# Patient Record
Sex: Female | Born: 1935 | Race: White | Hispanic: No | Smoking: Former smoker
Health system: Southern US, Community
[De-identification: ages and names within clinical notes are randomized; demographics above are authoritative.]

## PROBLEM LIST (undated history)

## (undated) DIAGNOSIS — I10 Essential (primary) hypertension: Secondary | ICD-10-CM

## (undated) DIAGNOSIS — E119 Type 2 diabetes mellitus without complications: Secondary | ICD-10-CM

## (undated) DIAGNOSIS — N390 Urinary tract infection, site not specified: Secondary | ICD-10-CM

## (undated) HISTORY — PX: TONSILLECTOMY: SUR1361

## (undated) HISTORY — PX: APPENDECTOMY: SHX54

## (undated) HISTORY — PX: BLADDER SURGERY: SHX569

## (undated) HISTORY — DX: Urinary tract infection, site not specified: N39.0

## (undated) HISTORY — PX: RADICAL HYSTERECTOMY: SHX2283

---

## 2004-03-12 ENCOUNTER — Ambulatory Visit: Payer: Self-pay | Admitting: Internal Medicine

## 2004-04-09 ENCOUNTER — Encounter: Payer: Self-pay | Admitting: Internal Medicine

## 2004-05-08 ENCOUNTER — Encounter: Payer: Self-pay | Admitting: Internal Medicine

## 2004-10-28 ENCOUNTER — Ambulatory Visit: Payer: Self-pay | Admitting: Neurology

## 2005-06-10 ENCOUNTER — Ambulatory Visit: Payer: Self-pay | Admitting: Internal Medicine

## 2006-07-07 ENCOUNTER — Ambulatory Visit: Payer: Self-pay | Admitting: Internal Medicine

## 2006-11-16 ENCOUNTER — Ambulatory Visit: Payer: Self-pay | Admitting: Vascular Surgery

## 2006-12-29 ENCOUNTER — Ambulatory Visit: Payer: Self-pay | Admitting: Urology

## 2007-07-29 ENCOUNTER — Ambulatory Visit: Payer: Self-pay | Admitting: Internal Medicine

## 2007-10-15 ENCOUNTER — Ambulatory Visit: Payer: Self-pay | Admitting: Internal Medicine

## 2007-10-26 ENCOUNTER — Ambulatory Visit: Payer: Self-pay | Admitting: Neurological Surgery

## 2008-05-11 ENCOUNTER — Ambulatory Visit: Payer: Self-pay | Admitting: Vascular Surgery

## 2008-05-12 ENCOUNTER — Ambulatory Visit: Payer: Self-pay | Admitting: Vascular Surgery

## 2008-07-31 ENCOUNTER — Ambulatory Visit: Payer: Self-pay | Admitting: Internal Medicine

## 2008-09-06 ENCOUNTER — Ambulatory Visit: Payer: Self-pay | Admitting: Gastroenterology

## 2009-03-16 ENCOUNTER — Ambulatory Visit: Payer: Self-pay | Admitting: Internal Medicine

## 2009-08-02 ENCOUNTER — Ambulatory Visit: Payer: Self-pay | Admitting: Internal Medicine

## 2009-09-04 ENCOUNTER — Ambulatory Visit: Payer: Self-pay | Admitting: Gastroenterology

## 2010-08-06 ENCOUNTER — Ambulatory Visit: Payer: Self-pay | Admitting: Urology

## 2010-08-22 ENCOUNTER — Ambulatory Visit: Payer: Self-pay | Admitting: Internal Medicine

## 2011-03-08 ENCOUNTER — Emergency Department: Payer: Self-pay | Admitting: *Deleted

## 2011-05-01 ENCOUNTER — Other Ambulatory Visit: Payer: Self-pay | Admitting: Gastroenterology

## 2011-05-01 LAB — CLOSTRIDIUM DIFFICILE BY PCR

## 2011-05-22 ENCOUNTER — Ambulatory Visit: Payer: Self-pay | Admitting: Gastroenterology

## 2011-05-26 LAB — PATHOLOGY REPORT

## 2011-09-02 ENCOUNTER — Ambulatory Visit: Payer: Self-pay | Admitting: Internal Medicine

## 2012-06-09 ENCOUNTER — Ambulatory Visit: Payer: Self-pay | Admitting: Gastroenterology

## 2012-08-23 ENCOUNTER — Emergency Department: Payer: Self-pay | Admitting: Emergency Medicine

## 2012-09-02 ENCOUNTER — Ambulatory Visit: Payer: Self-pay | Admitting: Internal Medicine

## 2014-01-11 ENCOUNTER — Ambulatory Visit: Payer: Self-pay | Admitting: Neurology

## 2014-01-25 ENCOUNTER — Ambulatory Visit: Payer: Self-pay | Admitting: Neurology

## 2014-05-15 ENCOUNTER — Encounter: Payer: Self-pay | Admitting: Neurology

## 2014-10-27 ENCOUNTER — Ambulatory Visit (INDEPENDENT_AMBULATORY_CARE_PROVIDER_SITE_OTHER): Payer: Medicare Other | Admitting: Urology

## 2014-10-27 ENCOUNTER — Encounter: Payer: Self-pay | Admitting: Urology

## 2014-10-27 VITALS — BP 165/65 | HR 64 | Resp 18 | Ht 62.0 in | Wt 150.2 lb

## 2014-10-27 DIAGNOSIS — N362 Urethral caruncle: Secondary | ICD-10-CM | POA: Diagnosis not present

## 2014-10-27 DIAGNOSIS — N390 Urinary tract infection, site not specified: Secondary | ICD-10-CM

## 2014-10-27 DIAGNOSIS — N3941 Urge incontinence: Secondary | ICD-10-CM | POA: Diagnosis not present

## 2014-10-27 DIAGNOSIS — N952 Postmenopausal atrophic vaginitis: Secondary | ICD-10-CM

## 2014-10-27 LAB — URINALYSIS, COMPLETE
BILIRUBIN UA: NEGATIVE
Glucose, UA: NEGATIVE
Nitrite, UA: POSITIVE — AB
PH UA: 5.5 (ref 5.0–7.5)
Protein, UA: NEGATIVE
RBC UA: NEGATIVE
Specific Gravity, UA: 1.02 (ref 1.005–1.030)
Urobilinogen, Ur: 0.2 mg/dL (ref 0.2–1.0)

## 2014-10-27 LAB — MICROSCOPIC EXAMINATION

## 2014-10-27 LAB — BLADDER SCAN AMB NON-IMAGING

## 2014-10-27 MED ORDER — ESTRADIOL 0.1 MG/GM VA CREA: 1.0000 | TOPICAL_CREAM | VAGINAL | Status: DC

## 2014-10-27 NOTE — Progress Notes (Signed)
10/27/2014 11:01 AM   Erin Thornton 11-05-1929 [id number]  Referring provider: Idelle Crouch, MD [name], [name] [address]  Chief Complaint  Patient presents with  . Recurrent UTI  . Establish Care    HPI: 81 yo F returns for follow up for recurrent UTIs.  She was treated for a UTI a few months ago (+UA 08/09/14, no culture).  She reports that she has several infections per year and there are at least 3-4 UAs which are suspicious for infection documented in care everywhere since 10/2014.Marland Kitchen  No fevers with her infections.  She does get confused when she does have infections.    She has recently had dysuria x 2 days, no gross hematuria, no increased urgency or frequency.  She thinks she may have an infection today.  She has urinary urgency and urge incontinence on a weekly basis. She wears one depdends daily.  She was given estrace cream about 3 year ago but quickly ran out of samples and has not used it since.  She has also tried cranberry tabs in the remote past it is not using them currently. She states that the Estrace cream was helpful but very expensive..     No history of kidney stones.      She does have history of cystocele s/p repair many years ago but thinks that it may have recurred.  She also is s/p hysterectomy and oophorectomy.    She was seen in our office in 2014.  She was also followed by [facility] but has not been seen in a few years.    PMH: Past Medical History  Diagnosis Date  . Recurrent UTI     Surgical History: Past Surgical History  Procedure Laterality Date  . Tonsillectomy    . Appendectomy    . Radical hysterectomy    . Bladder surgery      Home Medications:    Medication List       This list is accurate as of: 10/27/14 11:59 PM.  Always use your most recent med list.               benazepril-hydrochlorthiazide 20-12.5 MG per tablet  Commonly known as:  LOTENSIN HCT  Take 1 tablet by mouth daily.     COMBIVENT RESPIMAT 20-100 MCG/ACT Aers respimat  Generic drug:  Ipratropium-Albuterol  INHALE 1 PUFF TWICE A DAY     estradiol 0.1 MG/GM vaginal cream  Commonly known as:  ESTRACE  Place 1 Applicatorful vaginally 3 (three) times a week. Do not use applicator, use only pea sized amount 3 times weekly qhs, apply directly to urethra.     ferrous sulfate 325 (65 FE) MG tablet     FLUoxetine 20 MG tablet  Commonly known as:  PROZAC     fluticasone 50 MCG/ACT nasal spray  Commonly known as:  FLONASE  SPRAY 2 SPRAYS INTO BOTH NOSTRILS EVERY NIGHT     metformin 1000 MG (OSM) 24 hr tablet  Commonly known as:  FORTAMET  Take 1,000 mg by mouth daily.     metoprolol succinate 25 MG 24 hr tablet  Commonly known as:  TOPROL-XL  TAKE 0.5 TABLETS (12.5 MG TOTAL) BY MOUTH ONCE DAILY.     omeprazole 20 MG capsule  Commonly known as:  PRILOSEC  Take 20 mg by mouth daily.     rOPINIRole 0.25 MG tablet  Commonly known as:  REQUIP  Take 0.25 mg by mouth at bedtime.  rosuvastatin 5 MG tablet  Commonly known as:  CRESTOR     SYMBICORT 80-4.5 MCG/ACT inhaler  Generic drug:  budesonide-formoterol  Inhale 2 puffs into the lungs 2 (two) times daily.        Allergies:  Allergies  Allergen Reactions  . Amoxicillin Anaphylaxis  . Apixaban     Other reaction(s): Muscle Pain  . Atorvastatin     Other reaction(s): Other (See Comments) myalgias  . Levofloxacin     Other reaction(s): Other (See Comments) CNS side effects  . Lubiprostone Nausea Only  . Nitrofurantoin Monohyd Macro     Other reaction(s): Unknown  . Penicillins     As a child  . Pravastatin Other (See Comments)    myalgias  . Rosuvastatin     Other reaction(s): Muscle Pain  . Simvastatin Other (See Comments)    myalgias  . Statins     Other reaction(s): Muscle Pain  . Tetracycline     Other reaction(s): Other (See Comments) Hives and throat swelling  . Warfarin Sodium     Other reaction(s): Abdominal Pain  .  Sulfa Antibiotics Itching    Family History: Family History  Problem Relation Age of Onset  . Cancer Father   . Cancer Father   . Heart disease Father   . Parkinson's disease Mother     Social History:  reports that she quit smoking about 30 years ago. Her smoking use included Cigarettes. She does not have any smokeless tobacco history on file. She reports that she drinks alcohol. Her drug history is not on file.  ROS: UROLOGY Frequent Urination?: Yes Hard to postpone urination?: Yes Burning/pain with urination?: Yes Get up at night to urinate?: Yes Leakage of urine?: No Urine stream starts and stops?: No Trouble starting stream?: Yes Do you have to strain to urinate?: Yes Blood in urine?: No Urinary tract infection?: Yes Sexually transmitted disease?: No Injury to kidneys or bladder?: No Painful intercourse?: No Weak stream?: Yes Currently pregnant?: No Vaginal bleeding?: No Last menstrual period?: n  Gastrointestinal Nausea?: No Vomiting?: No Indigestion/heartburn?: Yes Diarrhea?: No Constipation?: Yes  Constitutional Fever: No Night sweats?: No Weight loss?: No Fatigue?: Yes  Skin Skin rash/lesions?: Yes Itching?: Yes  Eyes Blurred vision?: Yes Double vision?: No  Ears/Nose/Throat Sore throat?: Yes Sinus problems?: Yes  Hematologic/Lymphatic Swollen glands?: No Easy bruising?: No  Cardiovascular Leg swelling?: No Chest pain?: Yes  Respiratory Cough?: Yes Shortness of breath?: Yes  Endocrine Excessive thirst?: No  Musculoskeletal Back pain?: No Joint pain?: Yes  Neurological Headaches?: No Dizziness?: Yes  Psychologic Depression?: No Anxiety?: Yes  Physical Exam: BP 165/65 mmHg  Pulse 64  Resp 18  Ht _0  (1.575 m)  Wt 150 lb 3.2 oz (68.13 kg)  BMI 27.46 kg/m2  Constitutional:  Alert and oriented, No acute distress. HEENT: [place] AT, moist mucus membranes.  Trachea midline, no masses. Cardiovascular: No clubbing, cyanosis,  or edema. Respiratory: Normal respiratory effort, no increased work of breathing. GI: Abdomen is soft, nontender, nondistended, no abdominal masses GU:  Pelvic exam shows evidence of atrophic vaginitis otherwise normal external genitalia. Urethral meatus with small caruncle at the 06:00 position.  Decent vaginal support with minimal cystocele or rectocele. Skin: No rashes, bruises or suspicious lesions. Neurologic: Grossly intact, no focal deficits, moving all 4 extremities. Psychiatric: Normal mood and affect.  Laboratory Data: Comprehensive Metabolic Panel (CMP) - Final result (06/26/2014 8:40 AM) Comprehensive Metabolic Panel (CMP) - Final result (06/26/2014 8:40 AM)  Component Value Range  Glucose 100 70-110 mg/dL  Sodium 137 136-145 mmol/L  Potassium 4.7 3.6-5.1 mmol/L  Chloride 99 97-109 mmol/L  Carbon Dioxide (CO2) 31.0 22.0-32.0 mmol/L  Urea Nitrogen (BUN) 21 7-25 mg/dL  Creatinine 0.9 0.6-1.1 mg/dL  Glomerular Filtration Rate (eGFR), MDRD Estimate 60 (L) >60 mL/min/1.73sq m      Urinalysis Results for orders placed or performed in visit on 10/27/14  Microscopic Examination  Result Value Ref Range   WBC, UA 11-30 (A) 0 -  5 /hpf   RBC, UA 0-2 0 -  2 /hpf   Epithelial Cells (non renal) 0-10 0 - 10 /hpf   Bacteria, UA Many (A) None seen/Few  Urinalysis, Complete  Result Value Ref Range   Specific Gravity, UA 1.020 1.005 - 1.030   pH, UA 5.5 5.0 - 7.5   Color, UA Yellow Yellow   Appearance Ur Cloudy (A) Clear   Leukocytes, UA 2+ (A) Negative   Protein, UA Negative Negative/Trace   Glucose, UA Negative Negative   Ketones, UA Trace (A) Negative   RBC, UA Negative Negative   Bilirubin, UA Negative Negative   Urobilinogen, Ur 0.2 0.2 - 1.0 mg/dL   Nitrite, UA Positive (A) Negative   Microscopic Examination See below:   BLADDER SCAN AMB NON-IMAGING  Result Value Ref Range   Scan Result 76m     Pertinent Imaging: PVR 23 mL  Assessment & Plan:  A 81year-old  female with recurrent urinary tract infections, atrophic vaginitis and urge incontinence. She was also noted to have an incidental asymptomatic urethral caruncle on exam today.  1. Recurrent UTI Adressed hygiene issues today (lysed to avoid perfumed soaps, pouring vinegar over her urethra) as well as use of cranberry tabs and a vaginal Estridge and cream. We discussed how to use these products effectively. She is interested in pursuing both and samples of Estrace cream given his along with a prescription.  Although she does not have a history of stones, I do think it's reasonable to go ahead and proceed with a renal ultrasound to ensure that there is no upper tract anatomic issues contributing to her infections. We will order renal ultrasound for further workup.  UA suspicious for infection today, we will treat based on culture and sensitivity data. Agreeable with this plan. - Urinalysis, Complete - BLADDER SCAN AMB NON-IMAGING - [place]; Future - CULTURE, URINE COMPREHENSIVE  2. Urge incontinence Infrequent episodes of urge incontinence, approximately once per week. We discussed medications including possible side effects and she would prefer to avoid these if possible.  3. Atrophic vaginitis As above  4. Urethral caruncle As above    Return in about 3 months (around 01/27/2015) for any provider, symptoms recheck.  AHollice Espy MD  [address] [place] [phone] I spent 30 min with this patient of which greater than 50% was spent in counseling and coordination of care with the patient.

## 2014-10-28 ENCOUNTER — Encounter: Payer: Self-pay | Admitting: Urology

## 2014-10-30 ENCOUNTER — Telehealth: Payer: Self-pay | Admitting: Urology

## 2014-10-30 DIAGNOSIS — N390 Urinary tract infection, site not specified: Secondary | ICD-10-CM

## 2014-10-30 LAB — CULTURE, URINE COMPREHENSIVE

## 2014-10-30 NOTE — Telephone Encounter (Signed)
Ucx +.  Many allergies.  What happens when she takes cipro?  If just rash, itching or GI upset, she should benadryl.  Cipro 500 mg bid x 7 days (script not in yet).    Vanna Scotland, MD

## 2014-10-31 MED ORDER — CIPROFLOXACIN HCL 500 MG PO TABS: 500.0000 mg | ORAL_TABLET | Freq: Two times a day (BID) | ORAL | Status: DC

## 2014-10-31 NOTE — Telephone Encounter (Signed)
Spoke with pt in reference positive urine cx. Pt stated her reaction to cipro is dizziness but has ways of dealing with dizziness. Per Dr. Apolinar Junes medication was called in. Pt voiced understanding. Medication called into pharmacy.

## 2014-11-07 ENCOUNTER — Ambulatory Visit
Admission: RE | Admit: 2014-11-07 | Discharge: 2014-11-07 | Disposition: A | Discharge status: Home / Self Care | Payer: Medicare Other | Source: Ambulatory Visit | Attending: Urology | Admitting: Urology

## 2014-11-07 DIAGNOSIS — N390 Urinary tract infection, site not specified: Secondary | ICD-10-CM | POA: Diagnosis not present

## 2014-11-08 ENCOUNTER — Telehealth: Payer: Self-pay | Admitting: *Deleted

## 2014-11-08 NOTE — Telephone Encounter (Signed)
I spoke w/the patient and relayed their lab results.  The pt indicated understanding and had no questions. 

## 2014-11-08 NOTE — Telephone Encounter (Signed)
-----   Message from Vanna Scotland, MD sent at 11/08/2014 10:41 AM EDT ----- Please let this patient know that her renal ultrasound was normal.  We will plan to see her in follow up as scheduled.  Vanna Scotland, MD

## 2015-01-17 ENCOUNTER — Encounter: Payer: Self-pay | Admitting: *Deleted

## 2015-01-29 ENCOUNTER — Ambulatory Visit: Payer: Medicare Other | Admitting: Urology

## 2015-02-01 ENCOUNTER — Other Ambulatory Visit: Payer: Self-pay | Admitting: Neurology

## 2015-02-01 DIAGNOSIS — R410 Disorientation, unspecified: Secondary | ICD-10-CM

## 2015-02-01 DIAGNOSIS — R42 Dizziness and giddiness: Secondary | ICD-10-CM

## 2015-02-05 ENCOUNTER — Ambulatory Visit
Admission: RE | Admit: 2015-02-05 | Discharge: 2015-02-05 | Disposition: A | Discharge status: Home / Self Care | Payer: Medicare Other | Source: Ambulatory Visit | Attending: Neurology | Admitting: Neurology

## 2015-02-05 DIAGNOSIS — R410 Disorientation, unspecified: Secondary | ICD-10-CM | POA: Diagnosis present

## 2015-02-05 DIAGNOSIS — R42 Dizziness and giddiness: Secondary | ICD-10-CM | POA: Diagnosis present

## 2015-02-05 DIAGNOSIS — I739 Peripheral vascular disease, unspecified: Secondary | ICD-10-CM | POA: Diagnosis not present

## 2015-02-26 ENCOUNTER — Other Ambulatory Visit: Payer: Self-pay | Admitting: Nurse Practitioner

## 2015-02-26 DIAGNOSIS — K5909 Other constipation: Secondary | ICD-10-CM

## 2015-02-26 DIAGNOSIS — R109 Unspecified abdominal pain: Secondary | ICD-10-CM

## 2015-02-26 DIAGNOSIS — K5730 Diverticulosis of large intestine without perforation or abscess without bleeding: Secondary | ICD-10-CM

## 2015-03-23 ENCOUNTER — Ambulatory Visit: Payer: Medicare Other

## 2015-04-03 ENCOUNTER — Ambulatory Visit: Payer: Medicare Other

## 2015-04-06 ENCOUNTER — Ambulatory Visit
Admission: RE | Admit: 2015-04-06 | Discharge: 2015-04-06 | Disposition: A | Discharge status: Home / Self Care | Payer: Medicare Other | Source: Ambulatory Visit | Attending: Nurse Practitioner | Admitting: Nurse Practitioner

## 2015-04-06 DIAGNOSIS — K5909 Other constipation: Secondary | ICD-10-CM | POA: Diagnosis not present

## 2015-04-06 DIAGNOSIS — K5730 Diverticulosis of large intestine without perforation or abscess without bleeding: Secondary | ICD-10-CM

## 2015-04-06 DIAGNOSIS — K449 Diaphragmatic hernia without obstruction or gangrene: Secondary | ICD-10-CM | POA: Diagnosis not present

## 2015-04-06 DIAGNOSIS — R109 Unspecified abdominal pain: Secondary | ICD-10-CM | POA: Diagnosis present

## 2015-04-06 DIAGNOSIS — K760 Fatty (change of) liver, not elsewhere classified: Secondary | ICD-10-CM | POA: Insufficient documentation

## 2015-04-06 DIAGNOSIS — Z0189 Encounter for other specified special examinations: Secondary | ICD-10-CM | POA: Insufficient documentation

## 2015-04-06 DIAGNOSIS — I7090 Unspecified atherosclerosis: Secondary | ICD-10-CM | POA: Insufficient documentation

## 2015-04-06 HISTORY — DX: Essential (primary) hypertension: I10

## 2015-04-06 HISTORY — DX: Type 2 diabetes mellitus without complications: E11.9

## 2015-04-06 MED ORDER — IOHEXOL 300 MG/ML  SOLN
100.0000 mL | Freq: Once | INTRAMUSCULAR | Status: AC | PRN
  Administered 2015-04-06: 100 mL via INTRAVENOUS

## 2015-06-25 ENCOUNTER — Emergency Department: Payer: Medicare Other

## 2015-06-25 ENCOUNTER — Emergency Department
Admission: EM | Admit: 2015-06-25 | Discharge: 2015-06-25 | Disposition: A | Discharge status: Home / Self Care | Payer: Medicare Other | Attending: Emergency Medicine | Admitting: Emergency Medicine

## 2015-06-25 ENCOUNTER — Encounter: Payer: Self-pay | Admitting: Emergency Medicine

## 2015-06-25 DIAGNOSIS — Y92002 Bathroom of unspecified non-institutional (private) residence single-family (private) house as the place of occurrence of the external cause: Secondary | ICD-10-CM | POA: Insufficient documentation

## 2015-06-25 DIAGNOSIS — Z79899 Other long term (current) drug therapy: Secondary | ICD-10-CM | POA: Insufficient documentation

## 2015-06-25 DIAGNOSIS — Z87891 Personal history of nicotine dependence: Secondary | ICD-10-CM | POA: Diagnosis not present

## 2015-06-25 DIAGNOSIS — S22000A Wedge compression fracture of unspecified thoracic vertebra, initial encounter for closed fracture: Secondary | ICD-10-CM | POA: Insufficient documentation

## 2015-06-25 DIAGNOSIS — R51 Headache: Secondary | ICD-10-CM | POA: Diagnosis not present

## 2015-06-25 DIAGNOSIS — W1830XA Fall on same level, unspecified, initial encounter: Secondary | ICD-10-CM | POA: Insufficient documentation

## 2015-06-25 DIAGNOSIS — Y998 Other external cause status: Secondary | ICD-10-CM | POA: Insufficient documentation

## 2015-06-25 DIAGNOSIS — I10 Essential (primary) hypertension: Secondary | ICD-10-CM | POA: Diagnosis not present

## 2015-06-25 DIAGNOSIS — Z7984 Long term (current) use of oral hypoglycemic drugs: Secondary | ICD-10-CM | POA: Insufficient documentation

## 2015-06-25 DIAGNOSIS — M545 Low back pain: Secondary | ICD-10-CM | POA: Diagnosis present

## 2015-06-25 DIAGNOSIS — Y9301 Activity, walking, marching and hiking: Secondary | ICD-10-CM | POA: Insufficient documentation

## 2015-06-25 DIAGNOSIS — E119 Type 2 diabetes mellitus without complications: Secondary | ICD-10-CM | POA: Insufficient documentation

## 2015-06-25 DIAGNOSIS — Z7951 Long term (current) use of inhaled steroids: Secondary | ICD-10-CM | POA: Insufficient documentation

## 2015-06-25 MED ORDER — HYDROCODONE-ACETAMINOPHEN 5-325 MG PO TABS: 1.0000 | ORAL_TABLET | ORAL | Status: DC | PRN

## 2015-06-25 MED ORDER — ACETAMINOPHEN 325 MG PO TABS
650.0000 mg | ORAL_TABLET | Freq: Once | ORAL | Status: AC
  Administered 2015-06-25: 650 mg via ORAL
  Filled 2015-06-25: qty 2

## 2015-06-25 MED ORDER — HYDROCODONE-ACETAMINOPHEN 5-325 MG PO TABS
1.0000 | ORAL_TABLET | Freq: Once | ORAL | Status: DC
  Filled 2015-06-25: qty 1

## 2015-06-25 NOTE — ED Notes (Signed)
Pt returned from radiology.

## 2015-06-25 NOTE — ED Provider Notes (Signed)
[facility] Emergency Department Provider Note  ____________________________________________    I have reviewed the triage vital signs and the nursing notes.   HISTORY  Chief Complaint Fall    HPI Erin Thornton is a 82 y.o. female who presents after a fall. Patient reports she got up in the middle of the night to go to the bathroom she did use her walker but she thinks she pushed it too far of head of herself and lost her balance and fell backwards. She reports striking her lower back and her head. Currently she complains primarily of lower back pain and pain in her head. No neuro deficits. No dizziness. No chest pain no palpitations. No fevers or chills. No cough or shortness of breath.     Past Medical History  Diagnosis Date  . Recurrent UTI   . Hypertension   . Diabetes mellitus without complication [facility])     Patient takes Metformin.    There are no active problems to display for this patient.   Past Surgical History  Procedure Laterality Date  . Tonsillectomy    . Appendectomy    . Radical hysterectomy    . Bladder surgery      Current Outpatient Rx  Name  Route  Sig  Dispense  Refill  . benazepril-hydrochlorthiazide (LOTENSIN HCT) 20-12.5 MG per tablet   Oral   Take 1 tablet by mouth daily.      5   . ciprofloxacin (CIPRO) 500 MG tablet   Oral   Take 1 tablet (500 mg total) by mouth every 12 (twelve) hours.   14 tablet   0   . estradiol (ESTRACE) 0.1 MG/GM vaginal cream   Vaginal   Place 1 Applicatorful vaginally 3 (three) times a week. Do not use applicator, use only pea sized amount 3 times weekly qhs, apply directly to urethra.   42.5 g   12   . ferrous sulfate 325 (65 FE) MG tablet            11   . FLUoxetine (PROZAC) 20 MG tablet               . fluticasone (FLONASE) 50 MCG/ACT nasal spray      SPRAY 2 SPRAYS INTO BOTH NOSTRILS EVERY NIGHT      5   . Ipratropium-Albuterol (COMBIVENT RESPIMAT) 20-100  MCG/ACT AERS respimat      INHALE 1 PUFF TWICE A DAY         . metformin (FORTAMET) 1000 MG (OSM) 24 hr tablet   Oral   Take 1,000 mg by mouth daily.      5   . metoprolol succinate (TOPROL-XL) 25 MG 24 hr tablet      TAKE 0.5 TABLETS (12.5 MG TOTAL) BY MOUTH ONCE DAILY.      3   . omeprazole (PRILOSEC) 20 MG capsule   Oral   Take 20 mg by mouth daily.      6   . rOPINIRole (REQUIP) 0.25 MG tablet   Oral   Take 0.25 mg by mouth at bedtime.      5   . rosuvastatin (CRESTOR) 5 MG tablet               . SYMBICORT 80-4.5 MCG/ACT inhaler   Inhalation   Inhale 2 puffs into the lungs 2 (two) times daily.      3     Dispense as written.     Allergies Amoxicillin; Apixaban; Atorvastatin;  Levofloxacin; Lubiprostone; Nitrofurantoin monohyd macro; Penicillins; Pravastatin; Rosuvastatin; Simvastatin; Statins; Tetracycline; Warfarin sodium; and Sulfa antibiotics  Family History  Problem Relation Age of Onset  . Cancer Father     lung  . Heart disease Father   . Parkinson's disease Mother   . Colon cancer Father     Social History Social History  Substance Use Topics  . Smoking status: Former Smoker    Types: Cigarettes    Quit date: 10/26/1984  . Smokeless tobacco: None  . Alcohol Use: 0.0 oz/week    0 Standard drinks or equivalent per week     Comment: occasional    Review of Systems  Constitutional: Negative for fever. Eyes: Negative for redness ENT: Negative for sore throat Cardiovascular: Negative for chest pain or palpitations Respiratory: Negative for shortness of breath. Gastrointestinal: Negative for abdominal pain Genitourinary: Negative for dysuria. Musculoskeletal: Positive for back pain, mild neck pain Skin: Negative for rash. Neurological: Negative for focal weakness. Positive for headache Psychiatric: no anxiety    ____________________________________________   PHYSICAL EXAM:  VITAL SIGNS: ED Triage Vitals  Enc Vitals Group      BP 06/25/15 0818 146/57 mmHg     Pulse Rate 06/25/15 0818 74     Resp 06/25/15 0818 20     Temp 06/25/15 0819 97.7 F (36.5 C)     Temp Source 06/25/15 0818 Oral     SpO2 06/25/15 0818 97 %     Weight 06/25/15 0818 145 lb (65.772 kg)     Height 06/25/15 0818  (1.575 m)     Head Cir --      Peak Flow --      Pain Score 06/25/15 0818 6     Pain Loc --      Pain Edu? --      Excl. in GC? --      Constitutional: Alert and oriented. Well appearing and in no distress.  Eyes: Conjunctivae are normal. No erythema or injection ENT   Head: Normocephalic and atraumatic.   Mouth/Throat: Mucous membranes are moist. Cardiovascular: Normal rate, regular rhythm. Normal and symmetric distal pulses are present in the upper extremities. No murmurs or rubs  Respiratory: Normal respiratory effort without tachypnea nor retractions.  Gastrointestinal: Soft and non-tender in all quadrants. No distention. There is no CVA tenderness. Genitourinary: deferred Musculoskeletal: Nontender with normal range of motion in all extremities. No lower extremity tenderness nor edema. Range of motion at the hips is normal, no tenderness to palpation of the pelvis. Mild lumbar vertebral tenderness to palpation. Full range motion of the neck, no cervical tenderness to palpation or thoracic tenderness to palpation. Neurologic:  Normal speech and language. No gross focal neurologic deficits are appreciated. Skin:  Skin is warm, dry and intact. No rash noted. Psychiatric: Mood and affect are normal. Patient exhibits appropriate insight and judgment.  ____________________________________________    LABS (pertinent positives/negatives)  Labs Reviewed - No data to display  ____________________________________________   EKG  None  ____________________________________________    RADIOLOGY  T12 compression fracture  ____________________________________________   PROCEDURES  Procedure(s)  performed: none  Critical Care performed: none  ____________________________________________   INITIAL IMPRESSION / ASSESSMENT AND PLAN / ED COURSE  Pertinent labs & imaging results that were available during my care of the patient were reviewed by me and considered in my medical decision making (see chart for details).  Patient presents after a fall, likely mechanical. We will obtain CT head and cervical spine and lumbar x-rays.  Patient with compression fracture of T12 vertebrae. Neurologically intact. Discussed with patient and family. ____________________________________________   FINAL CLINICAL IMPRESSION(S) / ED DIAGNOSES  Final diagnoses:  Compression fracture of body of thoracic vertebra          Jene Everyobert Jaryn Rosko, MD 06/25/15 1652

## 2015-06-25 NOTE — ED Notes (Signed)
Pt to ed via [facility] from home with reports of having a fall early this am. Pt was up around 3am to go to br and fell backwards landing on her tailbone and states she hit the back of her head, no loc time of fall. Ems states vss. Pt c/o low back pain. Pt a/ox3 on arrival to ed.

## 2015-06-25 NOTE — ED Notes (Signed)
Patient transported to CT 

## 2015-06-25 NOTE — ED Notes (Signed)
Ambulated with walker. Pt has increased pain with ambulation but is able to ambulated. Dr Ernest Haberkinnner notified. Pt has family able to stay with her for a night or two and help with food.

## 2015-07-04 ENCOUNTER — Other Ambulatory Visit: Payer: Self-pay | Admitting: Orthopedic Surgery

## 2015-07-04 DIAGNOSIS — S22000A Wedge compression fracture of unspecified thoracic vertebra, initial encounter for closed fracture: Secondary | ICD-10-CM

## 2015-07-05 ENCOUNTER — Ambulatory Visit
Admission: RE | Admit: 2015-07-05 | Discharge: 2015-07-05 | Disposition: A | Discharge status: Home / Self Care | Payer: Medicare Other | Source: Ambulatory Visit | Attending: Orthopedic Surgery | Admitting: Orthopedic Surgery

## 2015-07-05 DIAGNOSIS — S22000A Wedge compression fracture of unspecified thoracic vertebra, initial encounter for closed fracture: Secondary | ICD-10-CM | POA: Insufficient documentation

## 2015-07-05 DIAGNOSIS — M4854XA Collapsed vertebra, not elsewhere classified, thoracic region, initial encounter for fracture: Secondary | ICD-10-CM | POA: Diagnosis not present

## 2015-07-05 SURGERY — LITHOTRIPSY, ESWL

## 2015-07-06 ENCOUNTER — Encounter: Payer: Self-pay | Admitting: *Deleted

## 2015-07-06 ENCOUNTER — Ambulatory Visit: Payer: Medicare Other | Admitting: Certified Registered"

## 2015-07-06 ENCOUNTER — Ambulatory Visit
Admission: RE | Admit: 2015-07-06 | Discharge: 2015-07-06 | Disposition: A | Discharge status: Home / Self Care | Payer: Medicare Other | Source: Ambulatory Visit | Attending: Orthopedic Surgery | Admitting: Orthopedic Surgery

## 2015-07-06 ENCOUNTER — Encounter
Admission: RE | Disposition: A | Discharge status: Home / Self Care | Payer: Self-pay | Source: Ambulatory Visit | Attending: Orthopedic Surgery

## 2015-07-06 ENCOUNTER — Ambulatory Visit: Payer: Medicare Other

## 2015-07-06 DIAGNOSIS — I739 Peripheral vascular disease, unspecified: Secondary | ICD-10-CM | POA: Diagnosis not present

## 2015-07-06 DIAGNOSIS — S22080A Wedge compression fracture of T11-T12 vertebra, initial encounter for closed fracture: Secondary | ICD-10-CM | POA: Diagnosis not present

## 2015-07-06 DIAGNOSIS — Z7951 Long term (current) use of inhaled steroids: Secondary | ICD-10-CM | POA: Diagnosis not present

## 2015-07-06 DIAGNOSIS — K219 Gastro-esophageal reflux disease without esophagitis: Secondary | ICD-10-CM | POA: Diagnosis not present

## 2015-07-06 DIAGNOSIS — Z419 Encounter for procedure for purposes other than remedying health state, unspecified: Secondary | ICD-10-CM

## 2015-07-06 DIAGNOSIS — Z7982 Long term (current) use of aspirin: Secondary | ICD-10-CM | POA: Insufficient documentation

## 2015-07-06 DIAGNOSIS — Z7984 Long term (current) use of oral hypoglycemic drugs: Secondary | ICD-10-CM | POA: Insufficient documentation

## 2015-07-06 DIAGNOSIS — G2581 Restless legs syndrome: Secondary | ICD-10-CM | POA: Diagnosis not present

## 2015-07-06 DIAGNOSIS — W07XXXA Fall from chair, initial encounter: Secondary | ICD-10-CM | POA: Insufficient documentation

## 2015-07-06 DIAGNOSIS — Z803 Family history of malignant neoplasm of breast: Secondary | ICD-10-CM | POA: Diagnosis not present

## 2015-07-06 DIAGNOSIS — Z882 Allergy status to sulfonamides status: Secondary | ICD-10-CM | POA: Insufficient documentation

## 2015-07-06 DIAGNOSIS — F419 Anxiety disorder, unspecified: Secondary | ICD-10-CM | POA: Diagnosis not present

## 2015-07-06 DIAGNOSIS — Z833 Family history of diabetes mellitus: Secondary | ICD-10-CM | POA: Diagnosis not present

## 2015-07-06 DIAGNOSIS — Z888 Allergy status to other drugs, medicaments and biological substances status: Secondary | ICD-10-CM | POA: Insufficient documentation

## 2015-07-06 DIAGNOSIS — Z9889 Other specified postprocedural states: Secondary | ICD-10-CM | POA: Diagnosis not present

## 2015-07-06 DIAGNOSIS — Z79899 Other long term (current) drug therapy: Secondary | ICD-10-CM | POA: Diagnosis not present

## 2015-07-06 DIAGNOSIS — Z9049 Acquired absence of other specified parts of digestive tract: Secondary | ICD-10-CM | POA: Diagnosis not present

## 2015-07-06 DIAGNOSIS — Z95820 Peripheral vascular angioplasty status with implants and grafts: Secondary | ICD-10-CM | POA: Diagnosis not present

## 2015-07-06 DIAGNOSIS — E785 Hyperlipidemia, unspecified: Secondary | ICD-10-CM | POA: Diagnosis not present

## 2015-07-06 DIAGNOSIS — E119 Type 2 diabetes mellitus without complications: Secondary | ICD-10-CM | POA: Diagnosis not present

## 2015-07-06 DIAGNOSIS — Z8249 Family history of ischemic heart disease and other diseases of the circulatory system: Secondary | ICD-10-CM | POA: Insufficient documentation

## 2015-07-06 DIAGNOSIS — J449 Chronic obstructive pulmonary disease, unspecified: Secondary | ICD-10-CM | POA: Diagnosis not present

## 2015-07-06 DIAGNOSIS — Z820 Family history of epilepsy and other diseases of the nervous system: Secondary | ICD-10-CM | POA: Insufficient documentation

## 2015-07-06 DIAGNOSIS — M1990 Unspecified osteoarthritis, unspecified site: Secondary | ICD-10-CM | POA: Diagnosis not present

## 2015-07-06 DIAGNOSIS — Z881 Allergy status to other antibiotic agents status: Secondary | ICD-10-CM | POA: Diagnosis not present

## 2015-07-06 DIAGNOSIS — Z880 Allergy status to penicillin: Secondary | ICD-10-CM | POA: Diagnosis not present

## 2015-07-06 DIAGNOSIS — Z800 Family history of malignant neoplasm of digestive organs: Secondary | ICD-10-CM | POA: Insufficient documentation

## 2015-07-06 DIAGNOSIS — Z90710 Acquired absence of both cervix and uterus: Secondary | ICD-10-CM | POA: Insufficient documentation

## 2015-07-06 DIAGNOSIS — M5030 Other cervical disc degeneration, unspecified cervical region: Secondary | ICD-10-CM | POA: Insufficient documentation

## 2015-07-06 DIAGNOSIS — S22089A Unspecified fracture of T11-T12 vertebra, initial encounter for closed fracture: Secondary | ICD-10-CM | POA: Diagnosis present

## 2015-07-06 DIAGNOSIS — F329 Major depressive disorder, single episode, unspecified: Secondary | ICD-10-CM | POA: Diagnosis not present

## 2015-07-06 DIAGNOSIS — Z801 Family history of malignant neoplasm of trachea, bronchus and lung: Secondary | ICD-10-CM | POA: Diagnosis not present

## 2015-07-06 DIAGNOSIS — I10 Essential (primary) hypertension: Secondary | ICD-10-CM | POA: Insufficient documentation

## 2015-07-06 HISTORY — PX: KYPHOPLASTY: SHX5884

## 2015-07-06 LAB — GLUCOSE, CAPILLARY
GLUCOSE-CAPILLARY: 115 mg/dL — AB (ref 65–99)
Glucose-Capillary: 133 mg/dL — ABNORMAL HIGH (ref 65–99)

## 2015-07-06 SURGERY — KYPHOPLASTY
Anesthesia: General | Wound class: Clean

## 2015-07-06 MED ORDER — ONDANSETRON HCL 4 MG/2ML IJ SOLN: 4.0000 mg | Freq: Four times a day (QID) | INTRAMUSCULAR | Status: DC | PRN

## 2015-07-06 MED ORDER — BUPIVACAINE-EPINEPHRINE (PF) 0.5% -1:200000 IJ SOLN
INTRAMUSCULAR | Status: DC | PRN
  Administered 2015-07-06: 20 mL via PERINEURAL

## 2015-07-06 MED ORDER — METOCLOPRAMIDE HCL 5 MG/ML IJ SOLN: 5.0000 mg | Freq: Three times a day (TID) | INTRAMUSCULAR | Status: DC | PRN

## 2015-07-06 MED ORDER — SODIUM CHLORIDE 0.9 % IV SOLN: INTRAVENOUS | Status: DC

## 2015-07-06 MED ORDER — HYDROCODONE-ACETAMINOPHEN 5-325 MG PO TABS: 1.0000 | ORAL_TABLET | ORAL | Status: DC | PRN

## 2015-07-06 MED ORDER — LIDOCAINE HCL (PF) 1 % IJ SOLN
INTRAMUSCULAR | Status: AC
  Filled 2015-07-06: qty 60

## 2015-07-06 MED ORDER — ONDANSETRON HCL 4 MG/2ML IJ SOLN: 4.0000 mg | Freq: Once | INTRAMUSCULAR | Status: DC | PRN

## 2015-07-06 MED ORDER — FENTANYL CITRATE (PF) 100 MCG/2ML IJ SOLN
INTRAMUSCULAR | Status: AC
  Administered 2015-07-06: 25 ug via INTRAVENOUS
  Filled 2015-07-06: qty 2

## 2015-07-06 MED ORDER — SODIUM CHLORIDE 0.9 % IV SOLN
INTRAVENOUS | Status: DC
  Administered 2015-07-06: 10:00:00 via INTRAVENOUS

## 2015-07-06 MED ORDER — PROPOFOL 500 MG/50ML IV EMUL
INTRAVENOUS | Status: DC | PRN
  Administered 2015-07-06: 50 ug/kg/min via INTRAVENOUS

## 2015-07-06 MED ORDER — FENTANYL CITRATE (PF) 100 MCG/2ML IJ SOLN
INTRAMUSCULAR | Status: DC | PRN
  Administered 2015-07-06 (×2): 50 ug via INTRAVENOUS

## 2015-07-06 MED ORDER — IOPAMIDOL (ISOVUE-M 200) INJECTION 41%
INTRAMUSCULAR | Status: DC | PRN
  Administered 2015-07-06: 10 mL via INTRATHECAL

## 2015-07-06 MED ORDER — BUPIVACAINE-EPINEPHRINE (PF) 0.5% -1:200000 IJ SOLN
INTRAMUSCULAR | Status: AC
  Filled 2015-07-06: qty 30

## 2015-07-06 MED ORDER — ONDANSETRON HCL 4 MG PO TABS: 4.0000 mg | ORAL_TABLET | Freq: Four times a day (QID) | ORAL | Status: DC | PRN

## 2015-07-06 MED ORDER — HYDROCODONE-ACETAMINOPHEN 5-325 MG PO TABS: 1.0000 | ORAL_TABLET | Freq: Four times a day (QID) | ORAL | Status: AC | PRN

## 2015-07-06 MED ORDER — FENTANYL CITRATE (PF) 100 MCG/2ML IJ SOLN
25.0000 ug | INTRAMUSCULAR | Status: AC | PRN
  Administered 2015-07-06 (×6): 25 ug via INTRAVENOUS

## 2015-07-06 MED ORDER — LIDOCAINE HCL (CARDIAC) 20 MG/ML IV SOLN
INTRAVENOUS | Status: DC | PRN
  Administered 2015-07-06: 50 mg via INTRAVENOUS

## 2015-07-06 MED ORDER — IOHEXOL 180 MG/ML  SOLN
INTRAMUSCULAR | Status: AC
  Filled 2015-07-06: qty 60

## 2015-07-06 MED ORDER — CLINDAMYCIN PHOSPHATE 600 MG/50ML IV SOLN
INTRAVENOUS | Status: AC
  Filled 2015-07-06: qty 50

## 2015-07-06 MED ORDER — CLINDAMYCIN PHOSPHATE 600 MG/50ML IV SOLN
600.0000 mg | Freq: Once | INTRAVENOUS | Status: AC
  Administered 2015-07-06: 600 mg via INTRAVENOUS

## 2015-07-06 MED ORDER — METOCLOPRAMIDE HCL 10 MG PO TABS: 5.0000 mg | ORAL_TABLET | Freq: Three times a day (TID) | ORAL | Status: DC | PRN

## 2015-07-06 MED ORDER — KETAMINE HCL 10 MG/ML IJ SOLN
INTRAMUSCULAR | Status: DC | PRN
  Administered 2015-07-06: 25 mg via INTRAVENOUS

## 2015-07-06 MED ORDER — LIDOCAINE HCL 1 % IJ SOLN
INTRAMUSCULAR | Status: DC | PRN
  Administered 2015-07-06: 20 mL
  Administered 2015-07-06: 10 mL

## 2015-07-06 SURGICAL SUPPLY — 13 items
CEMENT KYPHON CX01A KIT/MIXER (Cement) ×3 IMPLANT
DEVICE BIOPSY BONE KYPHX (INSTRUMENTS) ×3 IMPLANT
DRAPE C-ARM XRAY 36X54 (DRAPES) ×3 IMPLANT
DURAPREP 26ML APPLICATOR (WOUND CARE) ×3 IMPLANT
GLOVE SURG ORTHO 9.0 STRL STRW (GLOVE) ×3 IMPLANT
GOWN SPECIALTY ULTRA XL (MISCELLANEOUS) ×3 IMPLANT
GOWN STRL REUS W/ TWL LRG LVL3 (GOWN DISPOSABLE) ×1 IMPLANT
GOWN STRL REUS W/TWL LRG LVL3 (GOWN DISPOSABLE) ×2
LIQUID BAND (GAUZE/BANDAGES/DRESSINGS) ×3 IMPLANT
PACK KYPHOPLASTY (MISCELLANEOUS) ×3 IMPLANT
STRAP SAFETY BODY (MISCELLANEOUS) ×3 IMPLANT
TRAY KYPHOPAK 15/3 EXPRESS 1ST (MISCELLANEOUS) ×3 IMPLANT
TRAY KYPHOPAK 20/3 EXPRESS 1ST (MISCELLANEOUS) IMPLANT

## 2015-07-06 NOTE — Op Note (Signed)
07/06/2015  12:03 PM  PATIENT:  Erin Thornton  82 y.o. female  PRE-OPERATIVE DIAGNOSIS:  T12 COMPRESSION FRACTURE  POST-OPERATIVE DIAGNOSIS:  T12 COMPRESSION FRACTURE  PROCEDURE:  Procedure(s): KYPHOPLASTY (N/A)  SURGEON: Leitha SchullerMichael J Darrold Bezek, MD  ASSISTANTS: None  ANESTHESIA:   local and MAC  EBL:  Total I/O In: 900 [I.V.:900] Out: -   BLOOD ADMINISTERED:none  DRAINS: none   LOCAL MEDICATIONS USED:  MARCAINE    and LIDOCAINE   SPECIMEN:  Source of Specimen:  T12 vertebral body  DISPOSITION OF SPECIMEN:  PATHOLOGY  COUNTS:  YES  TOURNIQUET:  * No tourniquets in log *  IMPLANTS: Bone cement  DICTATION: .Dragon Dictation patient was brought to the operating room and after adequate sedation was given, she was placed prone. After C-arm was brought in and good visualization of T12 was obtained the timeout procedure with patient identification was completed. Skin was prepped with alcohol and 10 cc of 1% Xylocaine cultured on both sides. Next the back was prepped and draped in sterile fashion and repeat timeout procedure carried out. Spinal needle was used to get local down to the pedicle on both sides 50-50 mix of 1% Xylocaine have percent Sensorcaine with epinephrine. A small incision was made on the right side and trocar advanced and came and extrapedicular into the center of the vertebral body where a biopsy was obtained. Balloon inflation was to 4 cc and this gave very good correction past midline so only simple sided and approach was required. The cement was mixed and the balloon let down proximal before have cc of bone cement infiltrated with minimal extravasation to this some small vessels noted and very good fill of the vertebral body. After the cement had set trochars removed and permanent C-arm views obtained. Wound was closed with Dermabond and covered with a Band-Aid  PLAN OF CARE: Discharge to home after PACU  PATIENT DISPOSITION:  PACU - hemodynamically stable.

## 2015-07-06 NOTE — Anesthesia Preprocedure Evaluation (Signed)
Anesthesia Evaluation  Patient identified by MRN, date of birth, ID band Patient awake    Reviewed: Allergy & Precautions, NPO status , Patient's Chart, lab work & pertinent test results  Airway Mallampati: III  TM Distance: <3 FB Neck ROM: Limited    Dental  (+) Partial Upper, Missing   Pulmonary former smoker,  Sinus issues   Pulmonary exam normal breath sounds clear to auscultation       Cardiovascular hypertension, Pt. on medications Normal cardiovascular exam     Neuro/Psych negative neurological ROS  negative psych ROS   GI/Hepatic negative GI ROS, Neg liver ROS,   Endo/Other  diabetes, Well Controlled, Type 2, Oral Hypoglycemic Agents  Renal/GU negative Renal ROS  negative genitourinary   Musculoskeletal Fx T12   Abdominal Normal abdominal exam  (+)   Peds negative pediatric ROS (+)  Hematology negative hematology ROS (+)   Anesthesia Other Findings   Reproductive/Obstetrics                             Anesthesia Physical Anesthesia Plan  ASA: III  Anesthesia Plan: MAC and General   Post-op Pain Management:    Induction: Intravenous  Airway Management Planned: Nasal Cannula  Additional Equipment:   Intra-op Plan:   Post-operative Plan:   Informed Consent: I have reviewed the patients History and Physical, chart, labs and discussed the procedure including the risks, benefits and alternatives for the proposed anesthesia with the patient or authorized representative who has indicated his/her understanding and acceptance.   Dental advisory given  Plan Discussed with: CRNA and Surgeon  Anesthesia Plan Comments:         Anesthesia Quick Evaluation

## 2015-07-06 NOTE — Discharge Instructions (Addendum)
Take it easy today, activities as tolerated tomorrow. Remove Band-Aid on Sunday okay to shower after that.   AMBULATORY SURGERY  DISCHARGE INSTRUCTIONS   1) The drugs that you were given will stay in your system until tomorrow so for the next 24 hours you should not:  A) Drive an automobile B) Make any legal decisions C) Drink any alcoholic beverage   2) You may resume regular meals tomorrow.  Today it is better to start with liquids and gradually work up to solid foods.  You may eat anything you prefer, but it is better to start with liquids, then soup and crackers, and gradually work up to solid foods.   3) Please notify your doctor immediately if you have any unusual bleeding, trouble breathing, redness and pain at the surgery site, drainage, fever, or pain not relieved by medication.    Please contact your physician with any problems or Same Day Surgery at [phone], Monday through Friday 6 am to 4 pm, or [place] at [facility] Main number at [phone].  Percutaneous Vertebroplasty, Care After These instructions give you information on caring for yourself after your procedure. Your doctor may also give you more specific instructions. Call your doctor if you have any problems or questions after your procedure. HOME CARE  Take medicine as told by your doctor.  Keep your wound dry and covered for 24 hours or as told by your doctor.  Ask your doctor when you can bathe or shower.  Put an ice pack on your wound.  Put ice in a plastic bag.  Place a towel between your skin and the bag.  Leave the ice on for 15-20 minutes, 3-4 times a day.  Rest in your bed for 24 hours or as told by your doctor.  Return to normal activities as told by your doctor.  Ask your doctor what stretches and exercises you can do.  Do not bend or lift anything heavy as told by your doctor. GET HELP IF:  Your wound becomes red, puffy (swollen), or tender to the touch.  You are bleeding or  leaking fluid from the wound.  You are sick to your stomach (nauseous) or throw up (vomit) for more than 24 hours after the procedure.  Your back pain does not get better.  You have a fever. GET HELP RIGHT AWAY IF:   You have bad back pain that comes on suddenly.  You cannot control when you pee (urinate) or poop (bowel movement).  You lose feeling (numbness) or have tingling in your legs or feet, or they become weak.  You have sudden weakness in your arms or legs.  You have shooting pain down your legs.  You have chest pain or a hard time breathing.  You feel dizzy or pass out (faint).  Your vision changes or you cannot talk as you normally do.   This information is not intended to replace advice given to you by your health care provider. Make sure you discuss any questions you have with your health care provider.   Document Released: 06/18/2009 Document Revised: 03/29/2013 Document Reviewed: 11/30/2012 Elsevier Interactive Patient Education [employer]

## 2015-07-06 NOTE — H&P (Signed)
Reviewed paper H+P, will be scanned into chart. No changes noted.  

## 2015-07-06 NOTE — Transfer of Care (Signed)
Immediate Anesthesia Transfer of Care Note  Patient: Erin Thornton  Procedure(s) Performed: Procedure(s): KYPHOPLASTY (N/A)  Patient Location: PACU  Anesthesia Type:General  Level of Consciousness: awake and alert   Airway & Oxygen Therapy: Patient Spontanous Breathing and Patient connected to nasal cannula oxygen  Post-op Assessment: Report given to RN  Post vital signs: Reviewed  Last Vitals:  Filed Vitals:   07/06/15 0936 07/06/15 1156  BP: 162/45 158/64  Pulse: 56 81  Temp: 36.4 C 36.2 C  Resp: 14 18    Complications: No apparent anesthesia complications

## 2015-07-09 LAB — SURGICAL PATHOLOGY

## 2015-07-10 NOTE — Anesthesia Postprocedure Evaluation (Signed)
Anesthesia Post Note  Patient: Erin Thornton  Procedure(s) Performed: Procedure(s) (LRB): KYPHOPLASTY (N/A)  Patient location during evaluation: PACU Anesthesia Type: General Level of consciousness: awake and alert and oriented Pain management: pain level controlled Vital Signs Assessment: post-procedure vital signs reviewed and stable Respiratory status: spontaneous breathing Cardiovascular status: blood pressure returned to baseline Anesthetic complications: no    Last Vitals:  Filed Vitals:   07/06/15 1254 07/06/15 1320  BP: 152/58 121/52  Pulse: 64 65  Temp: 36.5 C   Resp: 16 16    Last Pain:  Filed Vitals:   07/06/15 1333  PainSc: 3                  Karolyne Timmons

## 2015-09-04 ENCOUNTER — Other Ambulatory Visit: Payer: Self-pay | Admitting: Internal Medicine

## 2015-09-04 DIAGNOSIS — Z1231 Encounter for screening mammogram for malignant neoplasm of breast: Secondary | ICD-10-CM

## 2015-09-07 ENCOUNTER — Ambulatory Visit
Admission: RE | Admit: 2015-09-07 | Discharge: 2015-09-07 | Disposition: A | Discharge status: Home / Self Care | Payer: Medicare Other | Source: Ambulatory Visit | Attending: Internal Medicine | Admitting: Internal Medicine

## 2015-09-07 ENCOUNTER — Other Ambulatory Visit: Payer: Self-pay | Admitting: Internal Medicine

## 2015-09-07 DIAGNOSIS — Z1231 Encounter for screening mammogram for malignant neoplasm of breast: Secondary | ICD-10-CM

## 2016-11-12 ENCOUNTER — Other Ambulatory Visit: Payer: Self-pay | Admitting: Internal Medicine

## 2016-11-12 DIAGNOSIS — R634 Abnormal weight loss: Secondary | ICD-10-CM

## 2016-11-14 ENCOUNTER — Encounter (INDEPENDENT_AMBULATORY_CARE_PROVIDER_SITE_OTHER): Payer: Medicare Other

## 2016-11-14 ENCOUNTER — Ambulatory Visit (INDEPENDENT_AMBULATORY_CARE_PROVIDER_SITE_OTHER): Payer: Medicare Other | Admitting: Vascular Surgery

## 2016-11-25 ENCOUNTER — Ambulatory Visit
Admission: RE | Admit: 2016-11-25 | Discharge: 2016-11-25 | Disposition: A | Discharge status: Home / Self Care | Payer: Medicare Other | Source: Ambulatory Visit | Attending: Internal Medicine | Admitting: Internal Medicine

## 2016-11-25 DIAGNOSIS — Z9049 Acquired absence of other specified parts of digestive tract: Secondary | ICD-10-CM | POA: Insufficient documentation

## 2016-11-25 DIAGNOSIS — Z90710 Acquired absence of both cervix and uterus: Secondary | ICD-10-CM | POA: Insufficient documentation

## 2016-11-25 DIAGNOSIS — K5730 Diverticulosis of large intestine without perforation or abscess without bleeding: Secondary | ICD-10-CM | POA: Diagnosis not present

## 2016-11-25 DIAGNOSIS — R634 Abnormal weight loss: Secondary | ICD-10-CM | POA: Insufficient documentation

## 2016-11-25 MED ORDER — IOPAMIDOL (ISOVUE-300) INJECTION 61%
80.0000 mL | Freq: Once | INTRAVENOUS | Status: AC | PRN
  Administered 2016-11-25: 80 mL via INTRAVENOUS

## 2017-03-30 ENCOUNTER — Other Ambulatory Visit: Payer: Self-pay | Admitting: Internal Medicine

## 2017-03-30 DIAGNOSIS — M79605 Pain in left leg: Secondary | ICD-10-CM

## 2017-04-08 ENCOUNTER — Ambulatory Visit: Payer: Medicare Other

## 2017-04-14 ENCOUNTER — Ambulatory Visit
Admission: RE | Admit: 2017-04-14 | Discharge: 2017-04-14 | Disposition: A | Discharge status: Home / Self Care | Payer: Medicare Other | Source: Ambulatory Visit | Attending: Internal Medicine | Admitting: Internal Medicine

## 2017-04-14 DIAGNOSIS — M79605 Pain in left leg: Secondary | ICD-10-CM | POA: Insufficient documentation

## 2017-10-20 ENCOUNTER — Ambulatory Visit (INDEPENDENT_AMBULATORY_CARE_PROVIDER_SITE_OTHER): Payer: Medicare Other | Admitting: Vascular Surgery

## 2017-11-10 ENCOUNTER — Telehealth (INDEPENDENT_AMBULATORY_CARE_PROVIDER_SITE_OTHER): Payer: Self-pay

## 2017-11-10 ENCOUNTER — Encounter (INDEPENDENT_AMBULATORY_CARE_PROVIDER_SITE_OTHER): Payer: Self-pay | Admitting: Vascular Surgery

## 2017-11-10 ENCOUNTER — Telehealth (INDEPENDENT_AMBULATORY_CARE_PROVIDER_SITE_OTHER): Payer: Self-pay | Admitting: Vascular Surgery

## 2017-11-10 ENCOUNTER — Ambulatory Visit (INDEPENDENT_AMBULATORY_CARE_PROVIDER_SITE_OTHER): Payer: Medicare Other | Admitting: Vascular Surgery

## 2017-11-10 VITALS — BP 147/57 | HR 61 | Resp 13 | Ht 60.0 in | Wt 129.0 lb

## 2017-11-10 DIAGNOSIS — I10 Essential (primary) hypertension: Secondary | ICD-10-CM | POA: Diagnosis not present

## 2017-11-10 DIAGNOSIS — I6529 Occlusion and stenosis of unspecified carotid artery: Secondary | ICD-10-CM | POA: Insufficient documentation

## 2017-11-10 DIAGNOSIS — I6523 Occlusion and stenosis of bilateral carotid arteries: Secondary | ICD-10-CM | POA: Diagnosis not present

## 2017-11-10 DIAGNOSIS — E119 Type 2 diabetes mellitus without complications: Secondary | ICD-10-CM

## 2017-11-10 NOTE — Telephone Encounter (Signed)
I called the patient back to let her know that Dr. Wyn Quakerew would be scheduling a CT angio of her Carotid and that she should be receiving a phone call from the imaging center soon.

## 2017-11-10 NOTE — Assessment & Plan Note (Signed)
The results of her carotid duplex were faxed over which suggests 70 to 99% right ICA stenosis by peak systolic velocity criteria but a lesser degree of stenosis with ratios and end-diastolic velocities.  The stenosis on the left appeared to be mild.  At this point, I think the most prudent option would be to perform a CT angiogram.  She would clearly be a fairly high risk patient, but we could assess the risk based on the degree of stenosis in the stroke risk after the CT scan.  We can see her back following that study.

## 2017-11-10 NOTE — Assessment & Plan Note (Signed)
blood glucose control important in reducing the progression of atherosclerotic disease. Also, involved in wound healing. On appropriate medications.  

## 2017-11-10 NOTE — Telephone Encounter (Signed)
-----   Message from Annice NeedyJason S Dew, MD sent at 11/10/2017  2:58 PM EDT ----- Got her US result and I am now ordering a CT angiogram as they are suggesting right ICA stenosis that is high grade.   Can you let the patient know this?

## 2017-11-10 NOTE — Progress Notes (Signed)
Patient ID: Erin Thornton, female   DOB: [birth year], 84 y.o.   MRN: [id number]  Chief Complaint  Patient presents with  . Carotid    Carotid Stenosis    HPI Erin HelperJean F Glasser is a 84 y.o. female.  I am asked to see the patient by Dr. Judithann SheenSparks for evaluation of carotid stenosis.  The patient reports having had a carotid duplex within the last month or 2.  She was a patient of ours but we have not seen her for at least 2 to 3 years as best I can tell.  I do not see a carotid ultrasound in the past couple of years in our system.  She has had progression of dementia.  She is also had issues with recurrent UTIs.  No focal stroke or TIA symptoms. Specifically, the patient denies amaurosis fugax, speech or swallowing difficulties, or arm or leg weakness or numbness    Past Medical History:  Diagnosis Date  . Diabetes mellitus without complication [facility](HCC)    Patient takes Metformin.  Marland Kitchen. Hypertension   . Recurrent UTI     Past Surgical History:  Procedure Laterality Date  . APPENDECTOMY    . BLADDER SURGERY    . KYPHOPLASTY N/A 07/06/2015   Procedure: KYPHOPLASTY;  Surgeon: Kennedy BuckerMichael Menz, MD;  Location: [facility] ORS;  Service: Orthopedics;  Laterality: N/A;  . RADICAL HYSTERECTOMY    . TONSILLECTOMY      Family History  Problem Relation Age of Onset  . Cancer Father        lung  . Heart disease Father   . Colon cancer Father   . Parkinson's disease Mother   . Breast cancer Sister      Social History Social History   Tobacco Use  . Smoking status: Former Smoker    Types: Cigarettes    Last attempt to quit: 10/26/1984    Years since quitting: 33.0  . Smokeless tobacco: Never Used  Substance Use Topics  . Alcohol use: Yes    Alcohol/week: 0.0 oz    Comment: occasional  . Drug use: No     Allergies  Allergen Reactions  . Amoxicillin Anaphylaxis  . Apixaban     Other reaction(s): Muscle Pain  . Atorvastatin     Other reaction(s): Other (See Comments) myalgias  .  Levofloxacin     Other reaction(s): Other (See Comments) CNS side effects  . Lubiprostone Nausea Only  . Nitrofurantoin Monohyd Macro     Other reaction(s): Unknown  . Penicillins     As a child  . Pravastatin Other (See Comments)    myalgias  . Rosuvastatin     Other reaction(s): Muscle Pain  . Simvastatin Other (See Comments)    myalgias  . Statins     Other reaction(s): Muscle Pain  . Tetracycline     Other reaction(s): Other (See Comments) Hives and throat swelling  . Warfarin Sodium     Other reaction(s): Abdominal Pain  . Sulfa Antibiotics Itching    Current Outpatient Medications  Medication Sig Dispense Refill  . aspirin 81 MG tablet Take 81 mg by mouth daily.    . benazepril-hydrochlorthiazide (LOTENSIN HCT) 20-12.5 MG per tablet Take 1 tablet by mouth daily.  5  . diphenhydrAMINE (BENADRYL) 25 MG tablet Take 25 mg by mouth at bedtime as needed.    Marland Kitchen. estradiol (ESTRACE) 0.1 MG/GM vaginal cream Place 1 Applicatorful vaginally 3 (three) times a week. Do not use applicator, use only pea sized  amount 3 times weekly qhs, apply directly to urethra. 42.5 g 12  . ferrous sulfate 325 (65 FE) MG tablet   11  . FLUoxetine (PROZAC) 20 MG tablet     . fluticasone (FLONASE) 50 MCG/ACT nasal spray SPRAY 2 SPRAYS INTO BOTH NOSTRILS EVERY NIGHT  5  . HYDROcodone-acetaminophen (NORCO) 5-325 MG tablet Take 1 tablet by mouth every 6 (six) hours as needed for moderate pain. 15 tablet 0  . Ipratropium-Albuterol (COMBIVENT RESPIMAT) 20-100 MCG/ACT AERS respimat Reported on 07/06/2015    . levETIRAcetam (KEPPRA) 250 MG tablet TAKE 1 TABLET (250 MG TOTAL) BY MOUTH ONCE DAILY.  10  . loratadine (CLARITIN) 10 MG tablet Take 10 mg by mouth daily as needed for allergies.    Marland Kitchen metformin (FORTAMET) 1000 MG (OSM) 24 hr tablet Take 1,000 mg by mouth daily.  5  . metoprolol succinate (TOPROL-XL) 25 MG 24 hr tablet TAKE 0.5 TABLETS (12.5 MG TOTAL) BY MOUTH ONCE DAILY.  3  . Naproxen Sodium (ALEVE PO)  Take by mouth.    Marland Kitchen omeprazole (PRILOSEC) 20 MG capsule Take 20 mg by mouth daily.  6  . rOPINIRole (REQUIP) 0.25 MG tablet Take 0.25 mg by mouth at bedtime.  5  . sertraline (ZOLOFT) 50 MG tablet TAKE 1 TABLET BY MOUTH EVERY DAY AT NIGHT  3  . SYMBICORT 80-4.5 MCG/ACT inhaler Inhale 2 puffs into the lungs 2 (two) times daily.  3   No current facility-administered medications for this visit.       REVIEW OF SYSTEMS (Negative unless checked)  Constitutional: [] Weight loss  [] Fever  [] Chills Cardiac: [] Chest pain   [] Chest pressure   [] Palpitations   [] Shortness of breath when laying flat   [] Shortness of breath at rest   [] Shortness of breath with exertion. Vascular:  [] Pain in legs with walking   [] Pain in legs at rest   [] Pain in legs when laying flat   [] Claudication   [] Pain in feet when walking  [] Pain in feet at rest  [] Pain in feet when laying flat   [] History of DVT   [] Phlebitis   [] Swelling in legs   [] Varicose veins   [] Non-healing ulcers Pulmonary:   [] Uses home oxygen   [] Productive cough   [] Hemoptysis   [] Wheeze  [] COPD   [] Asthma Neurologic:  [] Dizziness  [] Blackouts   [] Seizures   [] History of stroke   [] History of TIA  [] Aphasia   [] Temporary blindness   [] Dysphagia   [] Weakness or numbness in arms   [] Weakness or numbness in legs X positive for dementia Musculoskeletal:  [x] Arthritis   [] Joint swelling   [x] Joint pain   [] Low back pain Hematologic:  [] Easy bruising  [] Easy bleeding   [] Hypercoagulable state   [] Anemic  [] Hepatitis Gastrointestinal:  [] Blood in stool   [] Vomiting blood  [] Gastroesophageal reflux/heartburn   [] Abdominal pain Genitourinary:  [] Chronic kidney disease   [x] Difficult urination  [] Frequent urination  [] Burning with urination   [] Hematuria Skin:  [] Rashes   [] Ulcers   [] Wounds Psychological:  [] History of anxiety   []  History of major depression.    Physical Exam BP (!) 147/57 (BP Location: Left Arm, Patient Position: Sitting)   Pulse 61    Resp 13   Ht 5' (1.524 m)   Wt 129 lb (58.5 kg)   BMI 25.19 kg/m  Gen:  WD/WN, NAD Head: [place]/AT, No temporalis wasting.  Ear/Nose/Throat: Hearing grossly intact, nares w/o erythema or drainage, oropharynx w/o Erythema/Exudate Eyes: Conjunctiva clear, sclera non-icteric  Neck:  trachea midline.  Right carotid bruit present Pulmonary:  Good air movement, clear to auscultation bilaterally.  Cardiac: RRR, no JVD Vascular:  Vessel Right Left  Radial Palpable Palpable                                   Gastrointestinal: soft, non-tender/non-distended.  Musculoskeletal: M/S 5/5 throughout.  Extremities without ischemic changes.  No deformity or atrophy. No edema. Neurologic: Sensation grossly intact in extremities.  Symmetrical.  Speech is fluent. Motor exam as listed above. Psychiatric: Judgment intact, Mood & affect appropriate for pt's clinical situation. Dermatologic: No rashes or ulcers noted.  No cellulitis or open wounds.  Radiology No results found.  Labs No results found for this or any previous visit (from the past 2160 hour(s)).  Assessment/Plan:  Diabetes mellitus without complication (HCC) blood glucose control important in reducing the progression of atherosclerotic disease. Also, involved in wound healing. On appropriate medications.   Hypertension blood pressure control important in reducing the progression of atherosclerotic disease. On appropriate oral medications.   Carotid stenosis The results of her carotid duplex were faxed over which suggests 70 to 99% right ICA stenosis by peak systolic velocity criteria but a lesser degree of stenosis with ratios and end-diastolic velocities.  The stenosis on the left appeared to be mild.  At this point, I think the most prudent option would be to perform a CT angiogram.  She would clearly be a fairly high risk patient, but we could assess the risk based on the degree of stenosis in the stroke risk after the CT scan.  We  can see her back following that study.      Festus Barren 11/10/2017, 2:57 PM   This note was created with Dragon medical transcription system.  Any errors from dictation are unintentional.

## 2017-11-10 NOTE — Assessment & Plan Note (Signed)
blood pressure control important in reducing the progression of atherosclerotic disease. On appropriate oral medications.  

## 2017-11-10 NOTE — Patient Instructions (Signed)
Carotid Artery Disease The carotid arteries are arteries on both sides of the neck. They carry blood to the brain. Carotid artery disease is when the arteries get smaller (narrow) or get blocked. If these arteries get smaller or get blocked, you are more likely to have a stroke or warning stroke (transient ischemic attack). Follow these instructions at home:  Take medicines as told by your doctor. Make sure you understand all your medicine instructions. Do not stop your medicines without talking to your doctor first.  Follow your doctor's diet instructions. It is important to eat a healthy diet that includes plenty of: ? Fresh fruits. ? Vegetables. ? Lean meats.  Avoid: ? High-fat foods. ? High-sodium foods. ? Foods that are fried, overly processed, or have poor nutritional value.  Stay a healthy weight.  Stay active. Get at least 30 minutes of activity every day.  Do not smoke.  Limit alcohol use to: ? No more than 2 drinks a day for men. ? No more than 1 drink a day for women who are not pregnant.  Do not use illegal drugs.  Keep all doctor visits as told. Get help right away if:  You have sudden weakness or loss of feeling (numbness) on one side of the body, such as the face, arm, or leg.  You have sudden confusion.  You have trouble speaking (aphasia) or understanding.  You have sudden trouble seeing out of one or both eyes.  You have sudden trouble walking.  You have dizziness or feel like you might pass out (faint).  You have a loss of balance or your movements are not steady (uncoordinated).  You have a sudden, severe headache with no known cause.  You have trouble swallowing (dysphagia). Call your local emergency services (911 in [place] Do notdrive yourself to the clinic or hospital. This information is not intended to replace advice given to you by your health care provider. Make sure you discuss any questions you have with your health care  provider. Document Released: 03/10/2012 Document Revised: 08/30/2015 Document Reviewed: 09/22/2012 Elsevier Interactive Patient Education  2018 [employer]vier Inc.  

## 2017-11-11 ENCOUNTER — Encounter (INDEPENDENT_AMBULATORY_CARE_PROVIDER_SITE_OTHER): Payer: Self-pay

## 2017-11-23 ENCOUNTER — Ambulatory Visit
Admission: RE | Admit: 2017-11-23 | Discharge: 2017-11-23 | Disposition: A | Discharge status: Home / Self Care | Payer: Medicare Other | Source: Ambulatory Visit | Attending: Vascular Surgery | Admitting: Vascular Surgery

## 2017-11-23 DIAGNOSIS — I6502 Occlusion and stenosis of left vertebral artery: Secondary | ICD-10-CM | POA: Diagnosis not present

## 2017-11-23 DIAGNOSIS — I6523 Occlusion and stenosis of bilateral carotid arteries: Secondary | ICD-10-CM | POA: Diagnosis present

## 2017-11-23 MED ORDER — IOPAMIDOL (ISOVUE-370) INJECTION 76%
75.0000 mL | Freq: Once | INTRAVENOUS | Status: AC | PRN
  Administered 2017-11-23: 75 mL via INTRAVENOUS

## 2017-12-22 ENCOUNTER — Encounter (INDEPENDENT_AMBULATORY_CARE_PROVIDER_SITE_OTHER): Payer: Self-pay | Admitting: Vascular Surgery

## 2017-12-22 ENCOUNTER — Ambulatory Visit (INDEPENDENT_AMBULATORY_CARE_PROVIDER_SITE_OTHER): Payer: Medicare Other | Admitting: Vascular Surgery

## 2017-12-22 VITALS — BP 160/55 | HR 56 | Resp 14 | Ht 62.0 in | Wt 130.0 lb

## 2017-12-22 DIAGNOSIS — I10 Essential (primary) hypertension: Secondary | ICD-10-CM

## 2017-12-22 DIAGNOSIS — E119 Type 2 diabetes mellitus without complications: Secondary | ICD-10-CM

## 2017-12-22 DIAGNOSIS — I6523 Occlusion and stenosis of bilateral carotid arteries: Secondary | ICD-10-CM

## 2017-12-22 NOTE — Progress Notes (Signed)
MRN : [id number]  Erin Thornton is a 84 y.o. (March 14, 1930) female who presents with chief complaint of  Chief Complaint  Patient presents with  . Follow-up    review CT results  .  History of Present Illness: Patient returns today in follow up of her carotid stenosis.  No major changes or problems since her last visit last month. The patient has recently undergone a CT scan of the carotid arteries which I have independently reviewed.  The official report is of a 65% right ICA stenosis and of a 35% left ICA stenosis although at least a moderate stenosis is present in the proximal left common carotid artery.  I think this is a slight underestimate of the left internal carotid artery stenosis and a slight overestimate of the right internal carotid artery stenosis, but I think these ranges are reasonable.  Past Medical History:  Diagnosis Date  . Diabetes mellitus without complication [facility])    Patient takes Metformin.  Marland Kitchen Hypertension   . Recurrent UTI          Past Surgical History:  Procedure Laterality Date  . APPENDECTOMY    . BLADDER SURGERY    . KYPHOPLASTY N/A 07/06/2015   Procedure: KYPHOPLASTY;  Surgeon: Kennedy Bucker, MD;  Location: [facility] ORS;  Service: Orthopedics;  Laterality: N/A;  . RADICAL HYSTERECTOMY    . TONSILLECTOMY           Family History  Problem Relation Age of Onset  . Cancer Father        lung  . Heart disease Father   . Colon cancer Father   . Parkinson's disease Mother   . Breast cancer Sister      Social History Social History        Tobacco Use  . Smoking status: Former Smoker    Types: Cigarettes    Last attempt to quit: 10/26/1984    Years since quitting: 33.0  . Smokeless tobacco: Never Used  Substance Use Topics  . Alcohol use: Yes    Alcohol/week: 0.0 oz    Comment: occasional  . Drug use: No          Allergies  Allergen Reactions  . Amoxicillin Anaphylaxis  . Apixaban     Other  reaction(s): Muscle Pain  . Atorvastatin     Other reaction(s): Other (See Comments) myalgias  . Levofloxacin     Other reaction(s): Other (See Comments) CNS side effects  . Lubiprostone Nausea Only  . Nitrofurantoin Monohyd Macro     Other reaction(s): Unknown  . Penicillins     As a child  . Pravastatin Other (See Comments)    myalgias  . Rosuvastatin     Other reaction(s): Muscle Pain  . Simvastatin Other (See Comments)    myalgias  . Statins     Other reaction(s): Muscle Pain  . Tetracycline     Other reaction(s): Other (See Comments) Hives and throat swelling  . Warfarin Sodium     Other reaction(s): Abdominal Pain  . Sulfa Antibiotics Itching          Current Outpatient Medications  Medication Sig Dispense Refill  . aspirin 81 MG tablet Take 81 mg by mouth daily.    . benazepril-hydrochlorthiazide (LOTENSIN HCT) 20-12.5 MG per tablet Take 1 tablet by mouth daily.  5  . diphenhydrAMINE (BENADRYL) 25 MG tablet Take 25 mg by mouth at bedtime as needed.    Marland Kitchen estradiol (ESTRACE) 0.1 MG/GM vaginal cream Place 1  Applicatorful vaginally 3 (three) times a week. Do not use applicator, use only pea sized amount 3 times weekly qhs, apply directly to urethra. 42.5 g 12  . ferrous sulfate 325 (65 FE) MG tablet   11  . FLUoxetine (PROZAC) 20 MG tablet     . fluticasone (FLONASE) 50 MCG/ACT nasal spray SPRAY 2 SPRAYS INTO BOTH NOSTRILS EVERY NIGHT  5  . HYDROcodone-acetaminophen (NORCO) 5-325 MG tablet Take 1 tablet by mouth every 6 (six) hours as needed for moderate pain. 15 tablet 0  . Ipratropium-Albuterol (COMBIVENT RESPIMAT) 20-100 MCG/ACT AERS respimat Reported on 07/06/2015    . levETIRAcetam (KEPPRA) 250 MG tablet TAKE 1 TABLET (250 MG TOTAL) BY MOUTH ONCE DAILY.  10  . loratadine (CLARITIN) 10 MG tablet Take 10 mg by mouth daily as needed for allergies.    Marland Kitchen metformin (FORTAMET) 1000 MG (OSM) 24 hr tablet Take 1,000 mg by mouth  daily.  5  . metoprolol succinate (TOPROL-XL) 25 MG 24 hr tablet TAKE 0.5 TABLETS (12.5 MG TOTAL) BY MOUTH ONCE DAILY.  3  . Naproxen Sodium (ALEVE PO) Take by mouth.    Marland Kitchen omeprazole (PRILOSEC) 20 MG capsule Take 20 mg by mouth daily.  6  . rOPINIRole (REQUIP) 0.25 MG tablet Take 0.25 mg by mouth at bedtime.  5  . sertraline (ZOLOFT) 50 MG tablet TAKE 1 TABLET BY MOUTH EVERY DAY AT NIGHT  3  . SYMBICORT 80-4.5 MCG/ACT inhaler Inhale 2 puffs into the lungs 2 (two) times daily.  3   No current facility-administered medications for this visit.       REVIEW OF SYSTEMS (Negative unless checked)  Constitutional: [] Weight loss  [] Fever  [] Chills Cardiac: [] Chest pain   [] Chest pressure   [] Palpitations   [] Shortness of breath when laying flat   [] Shortness of breath at rest   [] Shortness of breath with exertion. Vascular:  [] Pain in legs with walking   [] Pain in legs at rest   [] Pain in legs when laying flat   [] Claudication   [] Pain in feet when walking  [] Pain in feet at rest  [] Pain in feet when laying flat   [] History of DVT   [] Phlebitis   [] Swelling in legs   [] Varicose veins   [] Non-healing ulcers Pulmonary:   [] Uses home oxygen   [] Productive cough   [] Hemoptysis   [] Wheeze  [] COPD   [] Asthma Neurologic:  [] Dizziness  [] Blackouts   [] Seizures   [] History of stroke   [] History of TIA  [] Aphasia   [] Temporary blindness   [] Dysphagia   [] Weakness or numbness in arms   [] Weakness or numbness in legs X positive for dementia Musculoskeletal:  [x] Arthritis   [] Joint swelling   [x] Joint pain   [] Low back pain Hematologic:  [] Easy bruising  [] Easy bleeding   [] Hypercoagulable state   [] Anemic  [] Hepatitis Gastrointestinal:  [] Blood in stool   [] Vomiting blood  [] Gastroesophageal reflux/heartburn   [] Abdominal pain Genitourinary:  [] Chronic kidney disease   [x] Difficult urination  [] Frequent urination  [] Burning with urination   [] Hematuria Skin:  [] Rashes   [] Ulcers    [] Wounds Psychological:  [] History of anxiety   []  History of major depression.   Physical Examination  BP (!) 160/55   Pulse (!) 56   Resp 14   Ht 5\' 2"  (1.575 m)   Wt 130 lb (59 kg)   BMI 23.78 kg/m  Gen:  WD/WN, NAD. Appears younger than stated age Head: [place]/AT, No temporalis wasting. Ear/Nose/Throat: Hearing grossly intact, nares  w/o erythema or drainage Eyes: Conjunctiva clear. Sclera non-icteric Neck: Supple.  Trachea midline Pulmonary:  Good air movement, no use of accessory muscles.  Cardiac: RRR, no JVD Vascular:  Vessel Right Left  Radial Palpable Palpable                                    Musculoskeletal: M/S 5/5 throughout.  No deformity or atrophy. No edema. Neurologic: Sensation grossly intact in extremities.  Symmetrical.  Speech is fluent.  Psychiatric: Judgment intact, Mood & affect appropriate for pt's clinical situation. Dermatologic: No rashes or ulcers noted.  No cellulitis or open wounds.       Labs No results found for this or any previous visit (from the past 2160 hour(s)).  Radiology Ct Angio Neck W/cm &/or Wo/cm  Result Date: 11/23/2017 CLINICAL DATA:  Bilateral carotid stenosis EXAM: CT ANGIOGRAPHY NECK TECHNIQUE: Multidetector CT imaging of the neck was performed using the standard protocol during bolus administration of intravenous contrast. Multiplanar CT image reconstructions and MIPs were obtained to evaluate the vascular anatomy. Carotid stenosis measurements (when applicable) are obtained utilizing NASCET criteria, using the distal internal carotid diameter as the denominator. CONTRAST:  75mL ISOVUE-370 IOPAMIDOL (ISOVUE-370) INJECTION 76% COMPARISON:  Carotid duplex 07/28/2017, CTA neck 05/12/2008 FINDINGS: Aortic arch: Atherosclerotic calcification in the aortic arch of a moderate to advanced degree. Negative for aneurysm or dissection. Atherosclerotic calcification in the proximal great vessels. Moderate to severe stenosis at the  origin of the left carotid artery and mild stenosis proximal left subclavian artery. Right carotid system: Mild atherosclerotic disease right common carotid artery. Mild atherosclerotic disease at the bifurcation. 2 cm distal to the carotid bifurcation, there is a noncalcified stenosis with irregular plaque morphology. Accurate measurements are difficult however this is approximately 65% diameter stenosis. Remainder of the right internal carotid artery patent. Left carotid system: Moderate to severe stenosis at the origin of the left common carotid artery. Calcific stenosis proximal left internal carotid artery approximately 35% diameter stenosis with mild progression since 2010 Vertebral arteries: Dominant left vertebral artery. Severe stenosis at the origin of the left vertebral artery. Remainder left vertebral artery is patent without significant stenosis Non dominant right vertebral artery without stenosis. Skeleton: Degenerative changes in the cervical spine. Sclerotic lesion T1 vertebral body unchanged from 2010, probable bone island. Other neck: Negative for mass or adenopathy in the neck. Upper chest: Emphysema with subpleural blebs bilaterally. No mass lesion. IMPRESSION: Irregular noncalcified plaque right internal carotid artery 2 cm distal bifurcation. Estimated 65% diameter stenosis. Moderate to severe stenosis origin of left common carotid artery. Calcific stenosis proximal left internal carotid artery, 35% diameter stenosis Severe stenosis origin left vertebral artery. Right vertebral artery widely patent. Electronically Signed   By: Marlan Palauharles  Clark M.D.   On: 11/23/2017 13:52    Assessment/Plan Diabetes mellitus without complication (HCC) blood glucose control important in reducing the progression of atherosclerotic disease. Also, involved in wound healing. On appropriate medications.   Hypertension blood pressure control important in reducing the progression of atherosclerotic disease. On  appropriate oral medications.  Carotid stenosis The patient is recently undergone a CT scan of the carotid arteries which I have independently reviewed.  The official report is of a 65% right ICA stenosis and of a 35% left ICA stenosis although at least a moderate stenosis is present in the proximal left common carotid artery.  I think this is a slight underestimate of  the left internal carotid artery stenosis and a slight overestimate of the right internal carotid artery stenosis, but I think these ranges are reasonable.  In a debilitated 84 year old female this would be well below the level I would consider operative or interventional repair.  I would recommend she continue her aspirin daily.  She has intolerance to statins.  I will recheck her in 6 months with a carotid duplex    Festus Barren, MD  12/22/2017 10:38 AM    This note was created with Dragon medical transcription system.  Any errors from dictation are purely unintentional

## 2017-12-22 NOTE — Assessment & Plan Note (Signed)
The patient is recently undergone a CT scan of the carotid arteries which I have independently reviewed.  The official report is of a 65% right ICA stenosis and of a 35% left ICA stenosis although at least a moderate stenosis is present in the proximal left common carotid artery.  I think this is a slight underestimate of the left internal carotid artery stenosis and a slight overestimate of the right internal carotid artery stenosis, but I think these ranges are reasonable.  In a debilitated 84 year old female this would be well below the level I would consider operative or interventional repair.  I would recommend she continue her aspirin daily.  She has intolerance to statins.  I will recheck her in 6 months with a carotid duplex

## 2018-02-02 ENCOUNTER — Ambulatory Visit (INDEPENDENT_AMBULATORY_CARE_PROVIDER_SITE_OTHER): Payer: Medicare Other | Admitting: Internal Medicine

## 2018-02-02 ENCOUNTER — Encounter: Payer: Self-pay | Admitting: Internal Medicine

## 2018-02-02 DIAGNOSIS — M1990 Unspecified osteoarthritis, unspecified site: Secondary | ICD-10-CM

## 2018-02-02 DIAGNOSIS — G309 Alzheimer's disease, unspecified: Secondary | ICD-10-CM

## 2018-02-02 DIAGNOSIS — I480 Paroxysmal atrial fibrillation: Secondary | ICD-10-CM | POA: Diagnosis not present

## 2018-02-02 DIAGNOSIS — I6523 Occlusion and stenosis of bilateral carotid arteries: Secondary | ICD-10-CM | POA: Diagnosis not present

## 2018-02-02 DIAGNOSIS — E785 Hyperlipidemia, unspecified: Secondary | ICD-10-CM | POA: Insufficient documentation

## 2018-02-02 DIAGNOSIS — F0280 Dementia in other diseases classified elsewhere without behavioral disturbance: Secondary | ICD-10-CM

## 2018-02-02 DIAGNOSIS — F0390 Unspecified dementia without behavioral disturbance: Secondary | ICD-10-CM | POA: Insufficient documentation

## 2018-02-02 NOTE — Progress Notes (Signed)
[facility]  Reason for Consult: Possible fungal infection Referring Provider: Dr. Aram Beecham  Assessment: I do not find any evidence of active infection causing her symptoms.  I did agree to review records from Dr. Wallene Huh and Dr. Gwen Pounds.  I do not recommend any evaluation or treatment for infection at this time.  Plan: 1. She signed a release of information.  I will review records when I obtain them and give her a call.  Patient Active Problem List   Diagnosis Date Noted  . Dyslipidemia 02/02/2018  . Paroxysmal atrial fibrillation (HCC) 02/02/2018  . Dementia (HCC) 02/02/2018  . Degenerative joint disease 02/02/2018  . Diabetes mellitus without complication (HCC) 11/10/2017  . Hypertension 11/10/2017  . Carotid stenosis 11/10/2017    Patient's Medications  New Prescriptions   No medications on file  Previous Medications   ASPIRIN 81 MG TABLET    Take 81 mg by mouth daily.   BENAZEPRIL-HYDROCHLORTHIAZIDE (LOTENSIN HCT) 20-12.5 MG PER TABLET    Take 1 tablet by mouth daily.   CEFUROXIME (CEFTIN) 250 MG TABLET    Take by mouth.   DIPHENHYDRAMINE (BENADRYL) 25 MG TABLET    Take 25 mg by mouth at bedtime as needed.   ESTRADIOL (ESTRACE) 0.1 MG/GM VAGINAL CREAM    Place 1 Applicatorful vaginally 3 (three) times a week. Do not use applicator, use only pea sized amount 3 times weekly qhs, apply directly to urethra.   FERROUS SULFATE 325 (65 FE) MG TABLET       FLUOXETINE (PROZAC) 20 MG TABLET       FLUTICASONE (FLONASE) 50 MCG/ACT NASAL SPRAY    SPRAY 2 SPRAYS INTO BOTH NOSTRILS EVERY NIGHT   HYDROCODONE-ACETAMINOPHEN (NORCO) 5-325 MG TABLET    Take 1 tablet by mouth every 6 (six) hours as needed for moderate pain.   IPRATROPIUM-ALBUTEROL (COMBIVENT RESPIMAT) 20-100 MCG/ACT AERS RESPIMAT    Reported on 07/06/2015   LEVETIRACETAM (KEPPRA) 250 MG TABLET    TAKE 1 TABLET (250 MG TOTAL) BY MOUTH ONCE DAILY.   LORATADINE (CLARITIN) 10 MG TABLET     Take 10 mg by mouth daily as needed for allergies.   METOPROLOL SUCCINATE (TOPROL-XL) 25 MG 24 HR TABLET    TAKE 0.5 TABLETS (12.5 MG TOTAL) BY MOUTH ONCE DAILY.   NAPROXEN SODIUM (ALEVE PO)    Take by mouth.   OMEPRAZOLE (PRILOSEC) 20 MG CAPSULE    Take 20 mg by mouth daily.   ROPINIROLE (REQUIP) 0.25 MG TABLET    Take 0.25 mg by mouth at bedtime.   SERTRALINE (ZOLOFT) 50 MG TABLET    TAKE 1 TABLET BY MOUTH EVERY DAY AT NIGHT   SYMBICORT 80-4.5 MCG/ACT INHALER    Inhale 2 puffs into the lungs 2 (two) times daily.  Modified Medications   No medications on file  Discontinued Medications   METFORMIN (FORTAMET) 1000 MG (OSM) 24 HR TABLET    Take 1,000 mg by mouth daily.    HPI: Erin Thornton is a 84 y.o. female with a history of diabetes and vascular dementia.  She was referred for evaluation of possible fungal infection.  A visit note from Dr. Judithann Sheen in February of this year noted that she had told him that she had "sore spots under my skin that hurt".  In October he noted that she was still obsessing over "fungal infections".  She has a variety of complaints.  She says that the problem  seemed to start about a year and a half ago when she noted irritation of her left scalp.  She describes what she feels in that area as "potholes" and "lava rocks".  She tells me that she saw Dr. Bobby Rumpf at the [facility] walk-in clinic last year.  She recalls that he took pictures of her scalp and told her that she had a fungal infection.  She does not remember how she was treated.  She says that she also saw Dr. Armida Sans, her dermatologist.  She thinks she saw him about 1 month ago although her caregiver, Eunice Blase, who is with her today has no recollection of that.  She says that she was given some liquid to put on her scalp.  She does not know the name of the medication and does not have the medication with her.  She does not recall what Dr. Gwen Pounds thought was causing her symptoms.  She says that she can peel  "black triangles" off of her skin.  She said that this morning she noticed that water was squirting out of the area on her scalp.  Review of Systems: Review of Systems  Unable to perform ROS: Mental acuity      Past Medical History:  Diagnosis Date  . Diabetes mellitus without complication [facility])    Patient takes Metformin.  Marland Kitchen Hypertension   . Recurrent UTI     Social History   Tobacco Use  . Smoking status: Former Smoker    Types: Cigarettes    Last attempt to quit: 10/26/1984    Years since quitting: 33.2  . Smokeless tobacco: Never Used  Substance Use Topics  . Alcohol use: Yes    Alcohol/week: 0.0 standard drinks    Comment: occasional  . Drug use: No    Family History  Problem Relation Age of Onset  . Cancer Father        lung  . Heart disease Father   . Colon cancer Father   . Parkinson's disease Mother   . Breast cancer Sister    Allergies  Allergen Reactions  . Amoxicillin Anaphylaxis  . Apixaban     Other reaction(s): Muscle Pain  . Atorvastatin     Other reaction(s): Other (See Comments) myalgias  . Levofloxacin     Other reaction(s): Other (See Comments) CNS side effects  . Lubiprostone Nausea Only  . Nitrofurantoin Monohyd Macro     Other reaction(s): Unknown  . Penicillins     As a child  . Pravastatin Other (See Comments)    myalgias  . Rosuvastatin     Other reaction(s): Muscle Pain  . Simvastatin Other (See Comments)    myalgias  . Statins     Other reaction(s): Muscle Pain  . Tetracycline     Other reaction(s): Other (See Comments) Hives and throat swelling  . Warfarin Sodium     Other reaction(s): Abdominal Pain  . Sulfa Antibiotics Itching    OBJECTIVE: Vitals:   02/02/18 1426  BP: (!) 173/69  Pulse: (!) 54  Temp: 98.2 F (36.8 C)   There is no height or weight on file to calculate BMI.   Physical Exam  Constitutional:  She is talkative and and anxious but otherwise in no distress.  HENT:  Mouth/Throat: No  oropharyngeal exudate.  Eyes: Conjunctivae are normal.  Skin: No rash noted.  A full inspection of her scalp does not reveal any obvious abnormalities.  She is constantly scratching on the left side of her scalp  and pulling at her hair.  Her caregiver, Eunice Blase, says that this is a chronic habit.  I do not notice any other rashes or unusual lesions on her skin.    Microbiology: No results found for this or any previous visit (from the past 240 hour(s)).  Cliffton Asters, MD [facility] [phone] pager   [phone] cell 02/02/2018, 5:12 PM

## 2018-02-04 ENCOUNTER — Emergency Department: Payer: Medicare Other

## 2018-02-04 ENCOUNTER — Emergency Department
Admission: EM | Admit: 2018-02-04 | Discharge: 2018-02-04 | Disposition: A | Discharge status: Home / Self Care | Payer: Medicare Other | Attending: Emergency Medicine | Admitting: Emergency Medicine

## 2018-02-04 DIAGNOSIS — R443 Hallucinations, unspecified: Secondary | ICD-10-CM

## 2018-02-04 DIAGNOSIS — Z87891 Personal history of nicotine dependence: Secondary | ICD-10-CM | POA: Insufficient documentation

## 2018-02-04 DIAGNOSIS — Z7982 Long term (current) use of aspirin: Secondary | ICD-10-CM | POA: Diagnosis not present

## 2018-02-04 DIAGNOSIS — I480 Paroxysmal atrial fibrillation: Secondary | ICD-10-CM | POA: Diagnosis not present

## 2018-02-04 DIAGNOSIS — I10 Essential (primary) hypertension: Secondary | ICD-10-CM | POA: Insufficient documentation

## 2018-02-04 DIAGNOSIS — E119 Type 2 diabetes mellitus without complications: Secondary | ICD-10-CM | POA: Insufficient documentation

## 2018-02-04 LAB — COMPREHENSIVE METABOLIC PANEL
ALT: 14 U/L (ref 0–44)
AST: 22 U/L (ref 15–41)
Albumin: 3.9 g/dL (ref 3.5–5.0)
Alkaline Phosphatase: 49 U/L (ref 38–126)
Anion gap: 7 (ref 5–15)
BUN: 17 mg/dL (ref 8–23)
CHLORIDE: 106 mmol/L (ref 98–111)
CO2: 29 mmol/L (ref 22–32)
Calcium: 9.3 mg/dL (ref 8.9–10.3)
Creatinine, Ser: 0.72 mg/dL (ref 0.44–1.00)
GFR calc Af Amer: >60 mL/min (ref >60)
Glucose, Bld: 92 mg/dL (ref 70–99)
POTASSIUM: 3.8 mmol/L (ref 3.5–5.1)
Sodium: 142 mmol/L (ref 135–145)
Total Bilirubin: 0.5 mg/dL (ref 0.3–1.2)
Total Protein: 7.2 g/dL (ref 6.5–8.1)

## 2018-02-04 LAB — CBC WITH DIFFERENTIAL/PLATELET
Abs Immature Granulocytes: 0.03 10*3/uL (ref 0.00–0.07)
BASOS ABS: 0.1 10*3/uL (ref 0.0–0.1)
BASOS PCT: 1 %
Eosinophils Absolute: 0.8 10*3/uL — ABNORMAL HIGH (ref 0.0–0.5)
Eosinophils Relative: 10 %
HCT: 45.5 % (ref 36.0–46.0)
HEMOGLOBIN: 14.5 g/dL (ref 12.0–15.0)
IMMATURE GRANULOCYTES: 0 %
LYMPHS PCT: 30 %
Lymphs Abs: 2.2 10*3/uL (ref 0.7–4.0)
MCH: 28.3 pg (ref 26.0–34.0)
MCHC: 31.9 g/dL (ref 30.0–36.0)
MCV: 88.9 fL (ref 80.0–100.0)
Monocytes Absolute: 0.7 10*3/uL (ref 0.1–1.0)
Monocytes Relative: 9 %
NEUTROS PCT: 50 %
Neutro Abs: 3.8 10*3/uL (ref 1.7–7.7)
PLATELETS: 234 10*3/uL (ref 150–400)
RBC: 5.12 MIL/uL — AB (ref 3.87–5.11)
RDW: 13.7 % (ref 11.5–15.5)
WBC: 7.6 10*3/uL (ref 4.0–10.5)
nRBC: 0 % (ref 0.0–0.2)

## 2018-02-04 LAB — LACTIC ACID, PLASMA: LACTIC ACID, VENOUS: 0.9 mmol/L (ref 0.5–1.9)

## 2018-02-04 LAB — URINALYSIS, ROUTINE W REFLEX MICROSCOPIC
Bacteria, UA: NONE SEEN
Bilirubin Urine: NEGATIVE
GLUCOSE, UA: NEGATIVE mg/dL
KETONES UR: NEGATIVE mg/dL
LEUKOCYTES UA: NEGATIVE
NITRITE: NEGATIVE
PH: 7 (ref 5.0–8.0)
Protein, ur: NEGATIVE mg/dL
Specific Gravity, Urine: 1.012 (ref 1.005–1.030)

## 2018-02-04 NOTE — ED Notes (Signed)
Patient denies pain and is resting comfortably.  

## 2018-02-04 NOTE — ED Notes (Signed)
Patient transported to CT 

## 2018-02-04 NOTE — ED Notes (Signed)
Patient discharged to home per MD order. Patient in stable condition, and deemed medically cleared by ED provider for discharge. Discharge instructions reviewed with patient/family using "Teach Back"; verbalized understanding of medication education and administration, and information about follow-up care. Denies further concerns. ° °

## 2018-02-04 NOTE — ED Triage Notes (Signed)
Pt has been having visual hallucinations since after lunch. Pt was recently dx with UTI and started on Keflex. Pt legally blind. Pt stating she is seeing "cartoon" characters. Pt stating pain near her left nasal passage. Pt in NAD at this time.

## 2018-02-04 NOTE — ED Provider Notes (Signed)
[facility] Emergency Department Provider Note   ____________________________________________   First MD Initiated Contact with Patient 02/04/18 1934     (approximate)  I have reviewed the triage vital signs and the nursing notes.   HISTORY  Chief Complaint Hallucinations    HPI Erin Thornton is a 84 y.o. female who this afternoon began having hallucinations.  She says she is seeing patient people with mean faces and tall buildings and sometimes flying pigs.  Patient understands that these are not really there.  She is otherwise awake alert and completely oriented and normal.  Patient took one Benadryl last night but did not have anything when she woke up this morning.  She took her antibiotics which sounds like it Keflex for her UTI after she woke up and then began having hallucinations sometime after that.  I discussed her with neurology on-call.  Neurology reminded me that a if you are not seeing things much like if you are partially blind is this lady is your brain can fill in the blank spaces with hallucinations.  Also a rare side effect of Keflex is hallucinations.  That combined with the Benadryl probably explains what is going on with the patient especially if she is otherwise completely normal.  Has never had this before.   Past Medical History:  Diagnosis Date  . Diabetes mellitus without complication [facility])    Patient takes Metformin.  Marland Kitchen Hypertension   . Recurrent UTI     Patient Active Problem List   Diagnosis Date Noted  . Dyslipidemia 02/02/2018  . Paroxysmal atrial fibrillation (HCC) 02/02/2018  . Dementia (HCC) 02/02/2018  . Degenerative joint disease 02/02/2018  . Diabetes mellitus without complication (HCC) 11/10/2017  . Hypertension 11/10/2017  . Carotid stenosis 11/10/2017    Past Surgical History:  Procedure Laterality Date  . APPENDECTOMY    . BLADDER SURGERY    . KYPHOPLASTY N/A 07/06/2015   Procedure: KYPHOPLASTY;   Surgeon: Kennedy Bucker, MD;  Location: [facility] ORS;  Service: Orthopedics;  Laterality: N/A;  . RADICAL HYSTERECTOMY    . TONSILLECTOMY      Prior to Admission medications   Medication Sig Start Date End Date Taking? Authorizing Provider  aspirin 81 MG tablet Take 81 mg by mouth daily.    [provider]  benazepril-hydrochlorthiazide (LOTENSIN HCT) 20-12.5 MG per tablet Take 1 tablet by mouth daily. 10/11/14   [provider]  cefUROXime (CEFTIN) 250 MG tablet Take by mouth. 01/29/18 02/08/18  [provider]  diphenhydrAMINE (BENADRYL) 25 MG tablet Take 25 mg by mouth at bedtime as needed.    [provider]  estradiol (ESTRACE) 0.1 MG/GM vaginal cream Place 1 Applicatorful vaginally 3 (three) times a week. Do not use applicator, use only pea sized amount 3 times weekly qhs, apply directly to urethra. 10/27/14   Vanna Scotland, MD  ferrous sulfate 325 (65 FE) MG tablet  09/20/14   [provider]  FLUoxetine (PROZAC) 20 MG tablet  10/22/14   [provider]  fluticasone (FLONASE) 50 MCG/ACT nasal spray SPRAY 2 SPRAYS INTO BOTH NOSTRILS EVERY NIGHT 10/11/14   [provider]  HYDROcodone-acetaminophen (NORCO) 5-325 MG tablet Take 1 tablet by mouth every 6 (six) hours as needed for moderate pain. 07/06/15   Kennedy Bucker, MD  Ipratropium-Albuterol (COMBIVENT RESPIMAT) 20-100 MCG/ACT AERS respimat Reported on 07/06/2015 06/07/14   [provider]  levETIRAcetam (KEPPRA) 250 MG tablet TAKE 1 TABLET (250 MG TOTAL) BY MOUTH ONCE DAILY. 10/23/17  [provider]  loratadine (CLARITIN) 10 MG tablet Take 10 mg by mouth daily as needed for allergies.    [provider]  metoprolol succinate (TOPROL-XL) 25 MG 24 hr tablet TAKE 0.5 TABLETS (12.5 MG TOTAL) BY MOUTH ONCE DAILY. 09/29/14   [provider]  Naproxen Sodium (ALEVE PO) Take by mouth.    [provider]  omeprazole (PRILOSEC) 20 MG capsule Take 20 mg by  mouth daily. 09/23/14   [provider]  rOPINIRole (REQUIP) 0.25 MG tablet Take 0.25 mg by mouth at bedtime. 10/11/14   [provider]  sertraline (ZOLOFT) 50 MG tablet TAKE 1 TABLET BY MOUTH EVERY DAY AT NIGHT 11/02/17   [provider]  SYMBICORT 80-4.5 MCG/ACT inhaler Inhale 2 puffs into the lungs 2 (two) times daily. 08/03/14   [provider]    Allergies Amoxicillin; Apixaban; Atorvastatin; Levofloxacin; Lubiprostone; Nitrofurantoin monohyd macro; Penicillins; Pravastatin; Rosuvastatin; Simvastatin; Statins; Tetracycline; Warfarin sodium; and Sulfa antibiotics  Family History  Problem Relation Age of Onset  . Cancer Father        lung  . Heart disease Father   . Colon cancer Father   . Parkinson's disease Mother   . Breast cancer Sister     Social History Social History   Tobacco Use  . Smoking status: Former Smoker    Types: Cigarettes    Last attempt to quit: 10/26/1984    Years since quitting: 33.2  . Smokeless tobacco: Never Used  Substance Use Topics  . Alcohol use: Yes    Alcohol/week: 0.0 standard drinks    Comment: occasional  . Drug use: No    Review of Systems  Constitutional: No fever/chills Eyes: No visual changes. ENT: No sore throat. Cardiovascular: Denies chest pain. Respiratory: Denies shortness of breath. Gastrointestinal: No abdominal pain.  No nausea, no vomiting.  No diarrhea.  No constipation. Genitourinary: Negative for dysuria. Musculoskeletal: Negative for back pain. Skin: Negative for rash. Neurological: Negative for headaches, focal weakness   ____________________________________________   PHYSICAL EXAM:  VITAL SIGNS: ED Triage Vitals  Enc Vitals Group     BP 02/04/18 1801 (!) 205/59     Pulse Rate 02/04/18 1801 70     Resp 02/04/18 1801 20     Temp 02/04/18 1801 98.1 F (36.7 C)     Temp Source 02/04/18 1801 Oral     SpO2 02/04/18 1754 98 %     Weight 02/04/18 1802 129 lb (58.5 kg)      Height 02/04/18 1802 5\' 2"  (1.575 m)     Head Circumference --      Peak Flow --      Pain Score 02/04/18 1802 0     Pain Loc --      Pain Edu? --      Excl. in GC? --     Constitutional: Alert and oriented. Well appearing and in no acute distress. Eyes: Conjunctivae are normal.  Head: Atraumatic. Nose: No congestion/rhinnorhea. Mouth/Throat: Mucous membranes are moist.  Oropharynx non-erythematous. Neck: No stridor.  Cardiovascular: Normal rate, regular rhythm. Grossly normal heart sounds.  Good peripheral circulation. Respiratory: Normal respiratory effort.  No retractions. Lungs CTAB. Gastrointestinal: Soft and nontender. No distention. No abdominal bruits. No CVA tenderness. Musculoskeletal: No lower extremity tenderness nor edema. Neurologic:  Normal speech and language. No gross focal neurologic deficits are appreciated.  Nerves II through XII are intact there is no ataxia motor strength is 5/5 throughout and patient does not report any numbness.  Skin:  Skin is warm, dry and intact. No rash noted. Psychiatric: Mood and affect are normal. Speech and behavior are normal.  ____________________________________________   LABS (all labs ordered are listed, but only abnormal results are displayed)  Labs Reviewed  CBC WITH DIFFERENTIAL/PLATELET - Abnormal; Notable for the following components:      Result Value   RBC 5.12 (*)    Eosinophils Absolute 0.8 (*)    All other components within normal limits  URINALYSIS, ROUTINE W REFLEX MICROSCOPIC - Abnormal; Notable for the following components:   Color, Urine YELLOW (*)    APPearance CLEAR (*)    Hgb urine dipstick SMALL (*)    All other components within normal limits  COMPREHENSIVE METABOLIC PANEL  LACTIC ACID, PLASMA  LACTIC ACID, PLASMA   ____________________________________________  EKG   ____________________________________________  RADIOLOGY  ED MD interpretation CT of the head read by radiology shows no acute  disease  Official radiology report(s): Ct Head Wo Contrast  Result Date: 02/04/2018 CLINICAL DATA:  Pt has been having visual hallucinations since after lunch. Pt was recently dx with UTI and started on Keflex. Pt legally blind. Pt stating she is seeing "cartoon" characters. EXAM: CT HEAD WITHOUT CONTRAST TECHNIQUE: Contiguous axial images were obtained from the base of the skull through the vertex without intravenous contrast. COMPARISON:  06/25/2015 FINDINGS: Brain: Diffuse parenchymal atrophy. Patchy areas of hypoattenuation in deep and periventricular white matter bilaterally. Negative for acute intracranial hemorrhage, mass lesion, acute infarction, midline shift, or mass-effect. Acute infarct may be inapparent on noncontrast CT. Ventricles and sulci symmetric. Vascular: Atherosclerotic and physiologic intracranial calcifications. Skull: Normal. Negative for fracture or focal lesion. Sinuses/Orbits: No acute finding. Other: None IMPRESSION: 1. Negative for bleed or other acute intracranial process. 2. Atrophy and nonspecific white matter changes. Electronically Signed   By: Corlis Leak M.D.   On: 02/04/2018 18:56    ____________________________________________   PROCEDURES  Procedure(s) performed:   Procedures  Critical Care performed:   ____________________________________________   INITIAL IMPRESSION / ASSESSMENT AND PLAN / ED COURSE  Patient looks well the hallucinations are likely produced by the combination mentioned in HPI I will let the patient go she will return here for any further problems or if she is not well tomorrow.  Hopefully by then the effects will wear off.      ____________________________________________   FINAL CLINICAL IMPRESSION(S) / ED DIAGNOSES  Final diagnoses:  Hallucinations     ED Discharge Orders    None       Note:  This document was prepared using Dragon voice recognition software and may include unintentional dictation errors.      Arnaldo Natal, MD 02/04/18 2040

## 2018-02-04 NOTE — Discharge Instructions (Signed)
The hallucinations may be a combination of the rare side effect of the antibiotic, the Benadryl and the fact that you are not seeing well.  As I mentioned to you the neurologist reminded me that when you are not seeing much your brain will make up things to fill the empty spaces.  That and the other 2 causes seem to be what are affecting you.  Please return here or see Dr. Judithann Sheen tomorrow afternoon if you are not well or come back sooner if you are worse.  Stop the antibiotics for now your urine is now clear hopefully that will be enough to keep your urine clear.

## 2018-03-05 ENCOUNTER — Other Ambulatory Visit: Payer: Self-pay

## 2018-03-05 ENCOUNTER — Emergency Department
Admission: EM | Admit: 2018-03-05 | Discharge: 2018-03-05 | Disposition: A | Discharge status: Home / Self Care | Payer: Medicare Other | Attending: Student in an Organized Health Care Education/Training Program | Admitting: Student in an Organized Health Care Education/Training Program

## 2018-03-05 DIAGNOSIS — N3000 Acute cystitis without hematuria: Secondary | ICD-10-CM | POA: Diagnosis not present

## 2018-03-05 DIAGNOSIS — F0390 Unspecified dementia without behavioral disturbance: Secondary | ICD-10-CM | POA: Insufficient documentation

## 2018-03-05 DIAGNOSIS — Z7984 Long term (current) use of oral hypoglycemic drugs: Secondary | ICD-10-CM | POA: Insufficient documentation

## 2018-03-05 DIAGNOSIS — E119 Type 2 diabetes mellitus without complications: Secondary | ICD-10-CM | POA: Insufficient documentation

## 2018-03-05 DIAGNOSIS — Z87891 Personal history of nicotine dependence: Secondary | ICD-10-CM | POA: Insufficient documentation

## 2018-03-05 DIAGNOSIS — R531 Weakness: Secondary | ICD-10-CM | POA: Diagnosis present

## 2018-03-05 DIAGNOSIS — Z79899 Other long term (current) drug therapy: Secondary | ICD-10-CM | POA: Diagnosis not present

## 2018-03-05 DIAGNOSIS — I10 Essential (primary) hypertension: Secondary | ICD-10-CM | POA: Diagnosis not present

## 2018-03-05 DIAGNOSIS — Z7982 Long term (current) use of aspirin: Secondary | ICD-10-CM | POA: Diagnosis not present

## 2018-03-05 LAB — BASIC METABOLIC PANEL
Anion gap: 10 (ref 5–15)
BUN: 17 mg/dL (ref 8–23)
CO2: 29 mmol/L (ref 22–32)
CREATININE: 0.67 mg/dL (ref 0.44–1.00)
Calcium: 9.3 mg/dL (ref 8.9–10.3)
Chloride: 105 mmol/L (ref 98–111)
GFR calc Af Amer: >60 mL/min (ref >60)
Glucose, Bld: 97 mg/dL (ref 70–99)
Potassium: 3.9 mmol/L (ref 3.5–5.1)
SODIUM: 144 mmol/L (ref 135–145)

## 2018-03-05 LAB — CBC
HCT: 46.2 % — ABNORMAL HIGH (ref 36.0–46.0)
Hemoglobin: 15.1 g/dL — ABNORMAL HIGH (ref 12.0–15.0)
MCH: 29.4 pg (ref 26.0–34.0)
MCHC: 32.7 g/dL (ref 30.0–36.0)
MCV: 89.9 fL (ref 80.0–100.0)
NRBC: 0 % (ref 0.0–0.2)
PLATELETS: 235 10*3/uL (ref 150–400)
RBC: 5.14 MIL/uL — AB (ref 3.87–5.11)
RDW: 13.7 % (ref 11.5–15.5)
WBC: 9.2 10*3/uL (ref 4.0–10.5)

## 2018-03-05 LAB — URINALYSIS, COMPLETE (UACMP) WITH MICROSCOPIC
Bilirubin Urine: NEGATIVE
GLUCOSE, UA: NEGATIVE mg/dL
HGB URINE DIPSTICK: NEGATIVE
Ketones, ur: NEGATIVE mg/dL
Nitrite: NEGATIVE
Protein, ur: NEGATIVE mg/dL
SPECIFIC GRAVITY, URINE: 1.015 (ref 1.005–1.030)
SQUAMOUS EPITHELIAL / LPF: NONE SEEN (ref 0–5)
pH: 6 (ref 5.0–8.0)

## 2018-03-05 MED ORDER — SODIUM CHLORIDE 0.9 % IV SOLN
1.0000 g | Freq: Once | INTRAVENOUS | Status: AC
  Administered 2018-03-05: 1 g via INTRAVENOUS
  Filled 2018-03-05: qty 10

## 2018-03-05 MED ORDER — LIDOCAINE 5 % EX PTCH
1.0000 | MEDICATED_PATCH | CUTANEOUS | Status: DC
  Administered 2018-03-05: 1 via TRANSDERMAL
  Filled 2018-03-05: qty 1

## 2018-03-05 MED ORDER — CEPHALEXIN 500 MG PO CAPS: 500.0000 mg | ORAL_CAPSULE | Freq: Three times a day (TID) | ORAL | 0 refills | Status: AC

## 2018-03-05 MED ORDER — ACETAMINOPHEN 325 MG PO TABS
ORAL_TABLET | ORAL | Status: AC
  Filled 2018-03-05: qty 2

## 2018-03-05 MED ORDER — ACETAMINOPHEN 325 MG PO TABS
650.0000 mg | ORAL_TABLET | Freq: Once | ORAL | Status: AC
  Administered 2018-03-05: 650 mg via ORAL

## 2018-03-05 NOTE — ED Triage Notes (Signed)
Pt to ED via EMS. Per ems pt had black tarry stools last night with n/v and has been feeling weak x1week per caregiver. Pt is confused at baseline. Pt c/o discomfort to lower abd. VSS.

## 2018-03-05 NOTE — ED Notes (Signed)
Pt unable to sign d/t hx of dementia. 

## 2018-03-05 NOTE — ED Provider Notes (Signed)
[facility] Emergency Department Provider Note    First MD Initiated Contact with Patient 03/05/18 1029     (approximate)  I have reviewed the triage vital signs and the nursing notes.   HISTORY  Chief Complaint Melena and Weakness    HPI Erin Thornton is a 84 y.o. female past listed medical history presents with generalized weakness and EMS reported seeing dark blood on her recliner sheet.  She denies any abdominal pain.  No fevers.  States she just recently had work-up in clinic that was reassuring.  Denies any diarrhea.  States that she did have hard formed stool several days ago.  States that she did have several episodes of nonbilious nonbloody emesis today.  No fevers.  Denies any chest pain or pressure.  No shortness of breath.    Past Medical History:  Diagnosis Date  . Diabetes mellitus without complication [facility](HCC)    Patient takes Metformin.  Marland Kitchen. Hypertension   . Recurrent UTI    Family History  Problem Relation Age of Onset  . Cancer Father        lung  . Heart disease Father   . Colon cancer Father   . Parkinson's disease Mother   . Breast cancer Sister    Past Surgical History:  Procedure Laterality Date  . APPENDECTOMY    . BLADDER SURGERY    . KYPHOPLASTY N/A 07/06/2015   Procedure: KYPHOPLASTY;  Surgeon: Kennedy BuckerMichael Menz, MD;  Location: [facility] ORS;  Service: Orthopedics;  Laterality: N/A;  . RADICAL HYSTERECTOMY    . TONSILLECTOMY     Patient Active Problem List   Diagnosis Date Noted  . Dyslipidemia 02/02/2018  . Paroxysmal atrial fibrillation (HCC) 02/02/2018  . Dementia (HCC) 02/02/2018  . Degenerative joint disease 02/02/2018  . Diabetes mellitus without complication (HCC) 11/10/2017  . Hypertension 11/10/2017  . Carotid stenosis 11/10/2017      Prior to Admission medications   Medication Sig Start Date End Date Taking? Authorizing Provider  aspirin 81 MG tablet Take 81 mg by mouth daily.   Yes [provider]    fluticasone (FLONASE) 50 MCG/ACT nasal spray SPRAY 2 SPRAYS INTO BOTH NOSTRILS EVERY NIGHT 10/11/14  Yes [provider]  haloperidol (HALDOL) 0.5 MG tablet Take 0.5 mg by mouth 2 (two) times daily. Patient states it lowers her blood pressure 100/40. 03/01/2018 02/19/18  Yes [provider]  levETIRAcetam (KEPPRA) 250 MG tablet TAKE 1 TABLET (250 MG TOTAL) BY MOUTH ONCE DAILY. 10/23/17  Yes [provider]  metoprolol succinate (TOPROL-XL) 25 MG 24 hr tablet 25 mg daily.  09/29/14  Yes [provider]  Naproxen Sodium (ALEVE PO) Take by mouth.   Yes [provider]  omeprazole (PRILOSEC) 20 MG capsule Take 20 mg by mouth daily. 09/23/14  Yes [provider]  rOPINIRole (REQUIP) 0.25 MG tablet Take 0.25 mg by mouth at bedtime. 10/11/14  Yes [provider]  sertraline (ZOLOFT) 50 MG tablet TAKE 1 TABLET BY MOUTH EVERY DAY AT NIGHT 11/02/17  Yes [provider]  benazepril-hydrochlorthiazide (LOTENSIN HCT) 20-12.5 MG per tablet Take 1 tablet by mouth daily. 10/11/14   [provider]  diphenhydrAMINE (BENADRYL) 25 MG tablet Take 25 mg by mouth at bedtime as needed.    [provider]  estradiol (ESTRACE) 0.1 MG/GM vaginal cream Place 1 Applicatorful vaginally 3 (three) times a week. Do not use applicator, use only pea sized amount 3 times weekly qhs, apply directly to urethra.  Patient not taking: Reported on 03/05/2018 10/27/14   Vanna Scotland, MD  ferrous sulfate 325 (65 FE) MG tablet  09/20/14   [provider]  FLUoxetine (PROZAC) 20 MG tablet  10/22/14   [provider]  HYDROcodone-acetaminophen (NORCO) 5-325 MG tablet Take 1 tablet by mouth every 6 (six) hours as needed for moderate pain. Patient not taking: Reported on 03/05/2018 07/06/15   Kennedy Bucker, MD  Ipratropium-Albuterol (COMBIVENT RESPIMAT) 20-100 MCG/ACT AERS respimat Reported on 07/06/2015 06/07/14   [provider]  loratadine  (CLARITIN) 10 MG tablet Take 10 mg by mouth daily as needed for allergies.    [provider]  SYMBICORT 80-4.5 MCG/ACT inhaler Inhale 2 puffs into the lungs 2 (two) times daily. 08/03/14   [provider]    Allergies Amoxicillin; Apixaban; Atorvastatin; Levofloxacin; Lubiprostone; Nitrofurantoin monohyd macro; Penicillins; Pravastatin; Rosuvastatin; Simvastatin; Statins; Tetracycline; Warfarin sodium; and Sulfa antibiotics    Social History Social History   Tobacco Use  . Smoking status: Former Smoker    Types: Cigarettes    Last attempt to quit: 10/26/1984    Years since quitting: 33.3  . Smokeless tobacco: Never Used  Substance Use Topics  . Alcohol use: Yes    Alcohol/week: 0.0 standard drinks    Comment: occasional  . Drug use: No    Review of Systems Patient denies headaches, rhinorrhea, blurry vision, numbness, shortness of breath, chest pain, edema, cough, abdominal pain, nausea, vomiting, diarrhea, dysuria, fevers, rashes or hallucinations unless otherwise stated above in HPI. ____________________________________________   PHYSICAL EXAM:  VITAL SIGNS: Vitals:   03/05/18 1030 03/05/18 1100  BP: (!) 187/55 (!) 183/65  Pulse: 78 79  Resp: 20 19  Temp:    SpO2: 96% 95%    Constitutional: Alert pleasant and non toxic appearing Eyes: Conjunctivae are normal.  Head: Atraumatic. Nose: No congestion/rhinnorhea. Mouth/Throat: Mucous membranes are moist.   Neck: No stridor. Painless ROM.  Cardiovascular: Normal rate, regular rhythm. Grossly normal heart sounds.  Good peripheral circulation. Respiratory: Normal respiratory effort.  No retractions. Lungs CTAB. Gastrointestinal: Soft and nontender. No distention. No abdominal bruits. No CVA tenderness. Genitourinary: With several large nonthrombosed hemorrhoids.  DRE shows no melena or hematochezia.  Weakly positive by guaiac. Musculoskeletal: No lower extremity tenderness nor edema.  No joint  effusions. Neurologic:  Normal speech and language. No gross focal neurologic deficits are appreciated. No facial droop Skin:  Skin is warm, dry and intact. No rash noted. Psychiatric: Mood and affect are normal.   ____________________________________________   LABS (all labs ordered are listed, but only abnormal results are displayed)  Results for orders placed or performed during the hospital encounter of 03/05/18 (from the past 24 hour(s))  Basic metabolic panel     Status: None   Collection Time: 03/05/18 10:32 AM  Result Value Ref Range   Sodium 144 135 - 145 mmol/L   Potassium 3.9 3.5 - 5.1 mmol/L   Chloride 105 98 - 111 mmol/L   CO2 29 22 - 32 mmol/L   Glucose, Bld 97 70 - 99 mg/dL   BUN 17 8 - 23 mg/dL   Creatinine, Ser 1.61 0.44 - 1.00 mg/dL   Calcium 9.3 8.9 - 09.6 mg/dL   GFR calc non Af Amer >60 >60 mL/min   GFR calc Af Amer >60 >60 mL/min   Anion gap 10 5 - 15  CBC     Status: Abnormal   Collection Time: 03/05/18 10:32 AM  Result Value Ref Range  WBC 9.2 4.0 - 10.5 K/uL   RBC 5.14 (H) 3.87 - 5.11 MIL/uL   Hemoglobin 15.1 (H) 12.0 - 15.0 g/dL   HCT 60.4 (H) 54.0 - 98.1 %   MCV 89.9 80.0 - 100.0 fL   MCH 29.4 26.0 - 34.0 pg   MCHC 32.7 30.0 - 36.0 g/dL   RDW 19.1 47.8 - 29.5 %   Platelets 235 150 - 400 K/uL   nRBC 0.0 0.0 - 0.2 %  Urinalysis, Complete w Microscopic     Status: Abnormal   Collection Time: 03/05/18 11:23 AM  Result Value Ref Range   Color, Urine YELLOW (A) YELLOW   APPearance HAZY (A) CLEAR   Specific Gravity, Urine 1.015 1.005 - 1.030   pH 6.0 5.0 - 8.0   Glucose, UA NEGATIVE NEGATIVE mg/dL   Hgb urine dipstick NEGATIVE NEGATIVE   Bilirubin Urine NEGATIVE NEGATIVE   Ketones, ur NEGATIVE NEGATIVE mg/dL   Protein, ur NEGATIVE NEGATIVE mg/dL   Nitrite NEGATIVE NEGATIVE   Leukocytes, UA SMALL (A) NEGATIVE   RBC / HPF 0-5 0 - 5 RBC/hpf   WBC, UA 21-50 0 - 5 WBC/hpf   Bacteria, UA RARE (A) NONE SEEN   Squamous Epithelial / LPF NONE SEEN 0 -  5   WBC Clumps PRESENT    Mucus PRESENT    Amorphous Crystal PRESENT    ____________________________________________ ____________________________________________  RADIOLOGY   ____________________________________________   PROCEDURES  Procedure(s) performed:  Procedures    Critical Care performed: no ____________________________________________   INITIAL IMPRESSION / ASSESSMENT AND PLAN / ED COURSE  Pertinent labs & imaging results that were available during my care of the patient were reviewed by me and considered in my medical decision making (see chart for details).   DDX: ugib, lgib, uti, hemorrhoids, u ti, dehydration, enteritis, gastritis  NEOSHA SWITALSKI is a 84 y.o. who presents to the ED with symptoms as described above.  Patient is nontoxic-appearing.  Vital signs are stable.  Her abdominal exam is soft and benign.  Will perform rectal exam and check blood work to evaluate for the above differential.  The patient will be placed on continuous pulse oximetry and telemetry for monitoring.  Laboratory evaluation will be sent to evaluate for the above complaints.     Clinical Course as of Mar 06 1355  Fri Mar 05, 2018  1145 No blood on digital rectal exam.  Does have very large nonthrombosed hemorrhoids and patient endorses now that she had very hard formed bowel movement which may have caused anal fissure.  Regardless patient without any evidence of acute blood loss anemia and is otherwise hemodynamically stable and not on any blood thinners.   [PR]  1243 Patient does show a right back to bacteria with 21-50 whites small leukocytes.  Does endorse some generalized weakness.  Will give Rocephin for UTI.  She is no signs of sepsis.  I do believe she stable and appropriate for outpatient follow-up.   [PR]    Clinical Course User Index [PR] Willy Eddy, MD     As part of my medical decision making, I reviewed the following data within the electronic MEDICAL RECORD NUMBER  Nursing notes reviewed and incorporated, Labs reviewed, notes from prior ED visits.   ____________________________________________   FINAL CLINICAL IMPRESSION(S) / ED DIAGNOSES  Final diagnoses:  Weakness  Acute cystitis without hematuria      NEW MEDICATIONS STARTED DURING THIS VISIT:  New Prescriptions   No medications on file  Note:  This document was prepared using Dragon voice recognition software and may include unintentional dictation errors.     Willy Eddy, MD 03/05/18 1359

## 2018-06-22 ENCOUNTER — Encounter (INDEPENDENT_AMBULATORY_CARE_PROVIDER_SITE_OTHER): Payer: Medicare Other

## 2018-06-22 ENCOUNTER — Ambulatory Visit (INDEPENDENT_AMBULATORY_CARE_PROVIDER_SITE_OTHER): Payer: Medicare Other | Admitting: Vascular Surgery

## 2018-10-05 ENCOUNTER — Other Ambulatory Visit: Payer: Self-pay

## 2018-10-05 ENCOUNTER — Ambulatory Visit (INDEPENDENT_AMBULATORY_CARE_PROVIDER_SITE_OTHER): Payer: Medicare Other | Admitting: Urology

## 2018-10-05 VITALS — BP 126/80 | HR 70

## 2018-10-05 DIAGNOSIS — R300 Dysuria: Secondary | ICD-10-CM

## 2018-10-05 DIAGNOSIS — N390 Urinary tract infection, site not specified: Secondary | ICD-10-CM

## 2018-10-05 NOTE — Progress Notes (Signed)
In and Out Catheterization  Patient is present today for a I & O catheterization due to uti symptoms. Patient was cleaned and prepped in a sterile fashion with betadine.  A 14FR cath was inserted no complications were noted  25ml of urine return was noted, urine was yellow in color as well as cloudy. Urine had foul odor. A clean urine sample was collected for ua and culture. Bladder was drained  And catheter was removed with out difficulty.    Preformed by: Gordy Clement, [place]

## 2018-10-05 NOTE — Progress Notes (Signed)
10/05/2018 9:29 AM   Erin Thornton 1929/09/17 [id number]  Referring provider: Marguarite ArbourSparks, Jeffrey D, MD [address] [facility] [place] [id number]  Chief Complaint  Patient presents with  . Dysuria    HPI: Erin FarberJean Thornton is an 85 year old female seen for recurrent UTIs.  She saw Dr. Apolinar JunesBrandon back in 2016 for the same problem and states [facility]..  She had a renal ultrasound which showed no upper tract abnormalities.  She was placed on Estrace cream.  Review of her records from renal clinic remarkable for 10+ urine cultures since December 2018; 8 with Proteus mirabilis and 2 with E. Coli.  Her UTI symptoms include dysuria and mental status changes.  She denies hospitalizations for UTIs or febrile UTIs.  She does have chronic frequency, urgency and urge incontinence.   PMH: Past Medical History:  Diagnosis Date  . Diabetes mellitus without complication [facility](HCC)    Patient takes Metformin.  Marland Kitchen. Hypertension   . Recurrent UTI     Surgical History: Past Surgical History:  Procedure Laterality Date  . APPENDECTOMY    . BLADDER SURGERY    . KYPHOPLASTY N/A 07/06/2015   Procedure: KYPHOPLASTY;  Surgeon: Kennedy BuckerMichael Menz, MD;  Location: [facility] ORS;  Service: Orthopedics;  Laterality: N/A;  . RADICAL HYSTERECTOMY    . TONSILLECTOMY      Home Medications:  Allergies as of 10/05/2018      Reactions   Amoxicillin Anaphylaxis   Apixaban    Other reaction(s): Muscle Pain   Atorvastatin    Other reaction(s): Other (See Comments) myalgias   Levofloxacin    Other reaction(s): Other (See Comments) CNS side effects   Lubiprostone Nausea Only   Nitrofurantoin Monohyd Macro    Other reaction(s): Unknown   Penicillins    As a child   Pravastatin Other (See Comments)   myalgias   Rosuvastatin    Other reaction(s): Muscle Pain   Simvastatin Other (See Comments)   myalgias   Statins    Other reaction(s): Muscle  Pain   Tetracycline    Other reaction(s): Other (See Comments) Hives and throat swelling   Warfarin Sodium    Other reaction(s): Abdominal Pain   Sulfa Antibiotics Itching      Medication List       Accurate as of October 05, 2018 11:59 PM. If you have any questions, ask your nurse or doctor.        STOP taking these medications   estradiol 0.1 MG/GM vaginal cream Commonly known as: ESTRACE Stopped by: Riki AltesScott C Makinzee Durley, MD     TAKE these medications   ALEVE PO Take by mouth.   aspirin 81 MG tablet Take 81 mg by mouth daily.   Benadryl 25 MG tablet Generic drug: diphenhydrAMINE Take 25 mg by mouth at bedtime as needed.   benazepril-hydrochlorthiazide 20-12.5 MG tablet Commonly known as: LOTENSIN HCT Take 1 tablet by mouth daily.   Combivent Respimat 20-100 MCG/ACT Aers respimat Generic drug: Ipratropium-Albuterol Reported on 07/06/2015   donepezil 5 MG tablet Commonly known as: ARICEPT Take by mouth.   ferrous sulfate 325 (65 FE) MG tablet   FLUoxetine 20 MG tablet Commonly known as: PROZAC   fluticasone 50 MCG/ACT nasal spray Commonly known as: FLONASE SPRAY 2 SPRAYS INTO BOTH NOSTRILS EVERY NIGHT   haloperidol 0.5 MG tablet Commonly known as: HALDOL Take 0.5 mg by mouth 2 (two) times daily. Patient states it lowers her  blood pressure 100/40. 03/01/2018   HYDROcodone-acetaminophen 5-325 MG tablet Commonly known as: Norco Take 1 tablet by mouth every 6 (six) hours as needed for moderate pain.   levETIRAcetam 250 MG tablet Commonly known as: KEPPRA TAKE 1 TABLET (250 MG TOTAL) BY MOUTH ONCE DAILY.   loratadine 10 MG tablet Commonly known as: CLARITIN Take 10 mg by mouth daily as needed for allergies.   metoprolol succinate 25 MG 24 hr tablet Commonly known as: TOPROL-XL 25 mg daily.   Monurol 3 g Pack Generic drug: fosfomycin TAKE 3 G BY MOUTH ONCE FOR 1 DOSE DISSOLVE IN 3 4 OUNCES OF WATER.   omeprazole 20 MG capsule Commonly known as: PRILOSEC  Take 20 mg by mouth daily.   rOPINIRole 0.25 MG tablet Commonly known as: REQUIP Take 0.25 mg by mouth at bedtime.   sertraline 50 MG tablet Commonly known as: ZOLOFT TAKE 1 TABLET BY MOUTH EVERY DAY AT NIGHT   Symbicort 80-4.5 MCG/ACT inhaler Generic drug: budesonide-formoterol Inhale 2 puffs into the lungs 2 (two) times daily.   vitamin B-12 1000 MCG tablet Commonly known as: CYANOCOBALAMIN Take by mouth.       Allergies:  Allergies  Allergen Reactions  . Amoxicillin Anaphylaxis  . Apixaban     Other reaction(s): Muscle Pain  . Atorvastatin     Other reaction(s): Other (See Comments) myalgias  . Levofloxacin     Other reaction(s): Other (See Comments) CNS side effects  . Lubiprostone Nausea Only  . Nitrofurantoin Monohyd Macro     Other reaction(s): Unknown  . Penicillins     As a child  . Pravastatin Other (See Comments)    myalgias  . Rosuvastatin     Other reaction(s): Muscle Pain  . Simvastatin Other (See Comments)    myalgias  . Statins     Other reaction(s): Muscle Pain  . Tetracycline     Other reaction(s): Other (See Comments) Hives and throat swelling  . Warfarin Sodium     Other reaction(s): Abdominal Pain  . Sulfa Antibiotics Itching    Family History: Family History  Problem Relation Age of Onset  . Cancer Father        lung  . Heart disease Father   . Colon cancer Father   . Parkinson's disease Mother   . Breast cancer Sister     Social History:  reports that she quit smoking about 33 years ago. Her smoking use included cigarettes. She has never used smokeless tobacco. She reports current alcohol use. She reports that she does not use drugs.  ROS: UROLOGY Frequent Urination?: Yes Hard to postpone urination?: Yes Burning/pain with urination?: Yes Get up at night to urinate?: Yes Leakage of urine?: Yes Urine stream starts and stops?: No Trouble starting stream?: No Do you have to strain to urinate?: No Blood in urine?: No  Urinary tract infection?: No Sexually transmitted disease?: No Injury to kidneys or bladder?: No Painful intercourse?: No Weak stream?: No Currently pregnant?: No Vaginal bleeding?: No Last menstrual period?: N/A  Gastrointestinal Nausea?: No Vomiting?: No Indigestion/heartburn?: No Diarrhea?: No Constipation?: No  Constitutional Fever: No Night sweats?: No Weight loss?: No Fatigue?: No  Skin Skin rash/lesions?: No Itching?: No  Eyes Blurred vision?: No Double vision?: No  Ears/Nose/Throat Sore throat?: No Sinus problems?: No  Hematologic/Lymphatic Swollen glands?: No Easy bruising?: No  Cardiovascular Leg swelling?: No Chest pain?: No  Respiratory Cough?: No Shortness of breath?: No  Endocrine Excessive thirst?: No  Musculoskeletal Back pain?: No  Joint pain?: No  Neurological Headaches?: No Dizziness?: No  Psychologic Depression?: No Anxiety?: No  Physical Exam: BP 126/80 (BP Location: Left Arm, Patient Position: Sitting, Cuff Size: Normal)   Pulse 70   Constitutional:  Alert, No acute distress. HEENT: [place] AT, moist mucus membranes.  Trachea midline, no masses. Cardiovascular: No clubbing, cyanosis, or edema. Respiratory: Normal respiratory effort, no increased work of breathing. GI: Abdomen is soft, nontender, nondistended, no abdominal masses GU: No CVA tenderness Lymph: No cervical or inguinal lymphadenopathy. Skin: No rashes, bruises or suspicious lesions. Neurologic: Grossly intact, no focal deficits, moving all 4 extremities. Psychiatric: Normal mood and affect.  Laboratory Data:  Urinalysis Dipstick 3+ leukocytes, trace blood Microscopy >30 WBC   Assessment & Plan:   85 year old female with recurrent lower tract UTIs.  She had a negative ultrasound in 2016 and a negative CT of the abdomen/pelvis in 2018.  Unlikely she would develop significant stones since that time accounting for her Proteus infections however will obtain a  KUB.  Based on her increasing infections I also recommended cystoscopy.  Urine was obtained via catheterization today which showed >30 WBC.  A urine culture was ordered  Riki AltesScott C Dejion Grillo, MD  [facility] [address], [address] [place], [place] [address] [phone]

## 2018-10-06 LAB — URINALYSIS, COMPLETE
Bilirubin, UA: NEGATIVE
Glucose, UA: NEGATIVE
Ketones, UA: NEGATIVE
Nitrite, UA: NEGATIVE
Specific Gravity, UA: 1.025 (ref 1.005–1.030)
Urobilinogen, Ur: 0.2 mg/dL (ref 0.2–1.0)
pH, UA: 5.5 (ref 5.0–7.5)

## 2018-10-06 LAB — MICROSCOPIC EXAMINATION: WBC, UA: >30 /HPF — AB (ref 0–5)

## 2018-10-07 ENCOUNTER — Telehealth: Payer: Self-pay | Admitting: Urology

## 2018-10-07 NOTE — Telephone Encounter (Signed)
Patient notified and voiced understanding.

## 2018-10-07 NOTE — Telephone Encounter (Signed)
Waiting on urine culture results.

## 2018-10-07 NOTE — Telephone Encounter (Signed)
Pt called office asking about RX that [facility] was supposed to send in.  She didn't know what medication.  She had OV w/Stoio[facility] on 6/30.

## 2018-10-07 NOTE — Telephone Encounter (Signed)
Patient states she is suppose to get a medication. You had verbally told her to get a medication. It has not been sent to pharmacy. Please advise on the medication.

## 2018-10-08 LAB — CULTURE, URINE COMPREHENSIVE

## 2018-10-09 ENCOUNTER — Other Ambulatory Visit: Payer: Self-pay | Admitting: Urology

## 2018-10-09 ENCOUNTER — Encounter: Payer: Self-pay | Admitting: Urology

## 2018-10-09 DIAGNOSIS — N390 Urinary tract infection, site not specified: Secondary | ICD-10-CM

## 2018-10-11 ENCOUNTER — Telehealth: Payer: Self-pay | Admitting: Family Medicine

## 2018-10-11 NOTE — Telephone Encounter (Signed)
Patient notified and voiced understanding.

## 2018-10-11 NOTE — Telephone Encounter (Signed)
-----   Message from Abbie Sons, MD sent at 10/09/2018 10:40 PM EDT ----- Urine culture positive for low level of Proteus.  She is either allergic or intolerant of susceptible oral antibiotics.  Would recommend infectious disease evaluation.

## 2018-10-21 ENCOUNTER — Encounter: Payer: Self-pay | Admitting: Urology

## 2018-10-21 ENCOUNTER — Other Ambulatory Visit: Payer: Self-pay

## 2018-10-21 ENCOUNTER — Ambulatory Visit (INDEPENDENT_AMBULATORY_CARE_PROVIDER_SITE_OTHER): Payer: Medicare Other | Admitting: Urology

## 2018-10-21 VITALS — BP 151/69 | HR 61

## 2018-10-21 DIAGNOSIS — N390 Urinary tract infection, site not specified: Secondary | ICD-10-CM

## 2018-10-21 LAB — URINALYSIS, COMPLETE
Bilirubin, UA: NEGATIVE
Glucose, UA: NEGATIVE
Ketones, UA: NEGATIVE
Nitrite, UA: POSITIVE — AB
Specific Gravity, UA: 1.025 (ref 1.005–1.030)
Urobilinogen, Ur: 0.2 mg/dL (ref 0.2–1.0)
pH, UA: 6.5 (ref 5.0–7.5)

## 2018-10-21 LAB — MICROSCOPIC EXAMINATION
RBC: NONE SEEN /hpf (ref 0–2)
WBC, UA: >30 /hpf — AB (ref 0–5)

## 2018-10-21 MED ORDER — LIDOCAINE HCL URETHRAL/MUCOSAL 2 % EX GEL
1.0000 "application " | Freq: Once | CUTANEOUS | Status: AC
  Administered 2018-10-21: 1 via URETHRAL

## 2018-10-21 NOTE — Progress Notes (Signed)
Patient scheduled for cystoscopy today for recurrent UTIs.  She had multidrug-resistant UTI and an ID consult was recommended however when contacted by that clinic she refused to make the appointment stating she thought her infection had resolved.  Urinalysis again today shows significant pyuria.  Her cystoscopy was postponed and she has agreed to see ID.  We will also obtain KUB.

## 2018-10-21 NOTE — Progress Notes (Signed)
In and Out Catheterization  Patient is present today for a I & O catheterization due to unstable gait and mobility. Patient was cleaned and prepped in a sterile fashion with betadine and Lidocaine 2% jelly was instilled into the urethra.  A 14FR cath was inserted no complications were noted , 94ml of urine return was noted, urine was yellow in color. A clean urine sample was collected for UA and culture. Bladder was drained and catheter was removed with out difficulty.    Preformed by: Gordy Clement, CMA ([place])  Follow up/ Additional notes: Pt and caregiver advised that referral will be placed for ID.

## 2018-10-24 LAB — CULTURE, URINE COMPREHENSIVE

## 2018-10-26 ENCOUNTER — Other Ambulatory Visit: Payer: Self-pay

## 2018-10-26 ENCOUNTER — Ambulatory Visit: Payer: Medicare Other | Attending: Infectious Diseases | Admitting: Infectious Diseases

## 2018-10-26 ENCOUNTER — Encounter: Payer: Self-pay | Admitting: Urology

## 2018-10-26 ENCOUNTER — Encounter: Payer: Self-pay | Admitting: Infectious Diseases

## 2018-10-26 ENCOUNTER — Telehealth: Payer: Self-pay

## 2018-10-26 VITALS — BP 165/65 | HR 61 | Temp 97.4°F | Ht 61.0 in

## 2018-10-26 DIAGNOSIS — Z880 Allergy status to penicillin: Secondary | ICD-10-CM

## 2018-10-26 DIAGNOSIS — Z888 Allergy status to other drugs, medicaments and biological substances status: Secondary | ICD-10-CM

## 2018-10-26 DIAGNOSIS — R32 Unspecified urinary incontinence: Secondary | ICD-10-CM | POA: Diagnosis not present

## 2018-10-26 DIAGNOSIS — Z993 Dependence on wheelchair: Secondary | ICD-10-CM

## 2018-10-26 DIAGNOSIS — R8271 Bacteriuria: Secondary | ICD-10-CM

## 2018-10-26 DIAGNOSIS — M1990 Unspecified osteoarthritis, unspecified site: Secondary | ICD-10-CM | POA: Diagnosis not present

## 2018-10-26 DIAGNOSIS — Z881 Allergy status to other antibiotic agents status: Secondary | ICD-10-CM

## 2018-10-26 DIAGNOSIS — N3945 Continuous leakage: Secondary | ICD-10-CM

## 2018-10-26 DIAGNOSIS — R358 Other polyuria: Secondary | ICD-10-CM

## 2018-10-26 DIAGNOSIS — Z87891 Personal history of nicotine dependence: Secondary | ICD-10-CM

## 2018-10-26 DIAGNOSIS — H548 Legal blindness, as defined in USA: Secondary | ICD-10-CM

## 2018-10-26 NOTE — Telephone Encounter (Signed)
Patient notified she states she has her appointment with ID today

## 2018-10-26 NOTE — Progress Notes (Signed)
NAME: Erin Thornton  DOB: Sep 18, 1929  MRN: [id number]  Date/Time: 10/26/2018 11:16 AM  REQUESTING PROVIDER Dr. Lonna CobbStoioff Subjective:  REASON FOR CONSULT: UTI ? Erin Thornton is a 85 y.o. female is referred to me for treatment of UTI.  Patient has had urinary incontinence for  at least 2 years now. She has had her urine checked many times and it is always shown either Proteus or E. coli.  She has received many courses of antibiotics in the past and says that one time she  had a bad yeast infection.  She has a few antibiotic allergies including penicillin, amoxicillin, sulfa, tetracycline and Levaquin.  She had received a dose of fosfomycin in June 2020. Patient currently has no symptoms other than incontinence.  She also says she passes a lot of urine.  She says she had a straight cath done last week at Dr. Heywood FootmanStoioff's office that had caused a little burning then. Patient always wears depends. She is wheelchair-bound because of bad arthritis. She lives on her own and has home help.  Her home help a nurse assistant was in her car and I called her into the office.  Patient stays at home overnight on her own.  Every morning at 9 AM the nurse assistant goes to her home and cleans her up.  She says she always found her soaking wet because of incontinence. As patient is unable to have a shower she gets a body wash in the bed.  Patient complains of feeling  Sticky as well as a crawling sensation on her skin and her head.  Dr. Lonna CobbStoioff the urologist was planning to do a cystoscopy because of the Proteus and to rule out any bladder stone.  He wanted to give antibiotics before the procedure and hence he sent the patient to me. Patient has no fever. She has back pain which she thinks is coming from her kidneys but very likely it is arthritis in the vertebrae Past Medical History:  Diagnosis Date  . Diabetes mellitus without complication [facility](HCC)    Patient takes Metformin.  Marland Kitchen. Hypertension   . Recurrent UTI      Past Surgical History:  Procedure Laterality Date  . APPENDECTOMY    . BLADDER SURGERY    . KYPHOPLASTY N/A 07/06/2015   Procedure: KYPHOPLASTY;  Surgeon: Kennedy BuckerMichael Menz, MD;  Location: [facility] ORS;  Service: Orthopedics;  Laterality: N/A;  . RADICAL HYSTERECTOMY    . TONSILLECTOMY      Social History   Socioeconomic History  . Marital status: Widowed    Spouse name: Not on file  . Number of children: Not on file  . Years of education: Not on file  . Highest education level: Not on file  Occupational History  . Not on file  Social Needs  . Financial resource strain: Not on file  . Food insecurity    Worry: Not on file    Inability: Not on file  . Transportation needs    Medical: Not on file    Non-medical: Not on file  Tobacco Use  . Smoking status: Former Smoker    Types: Cigarettes    Quit date: 10/26/1984    Years since quitting: 34.0  . Smokeless tobacco: Never Used  Substance and Sexual Activity  . Alcohol use: Yes    Alcohol/week: 0.0 standard drinks    Comment: occasional  . Drug use: No  . Sexual activity: Not on file  Lifestyle  . Physical activity    Days  per week: Not on file    Minutes per session: Not on file  . Stress: Not on file  Relationships  . Social Musician on phone: Not on file    Gets together: Not on file    Attends religious service: Not on file    Active member of club or organization: Not on file    Attends meetings of clubs or organizations: Not on file    Relationship status: Not on file  . Intimate partner violence    Fear of current or ex partner: Not on file    Emotionally abused: Not on file    Physically abused: Not on file    Forced sexual activity: Not on file  Other Topics Concern  . Not on file  Social History Narrative  . Not on file    Family History  Problem Relation Age of Onset  . Cancer Father        lung  . Heart disease Father   . Colon cancer Father   . Parkinson's disease Mother   . Breast  cancer Sister    Allergies  Allergen Reactions  . Amoxicillin Anaphylaxis  . Apixaban     Other reaction(s): Muscle Pain  . Atorvastatin     Other reaction(s): Other (See Comments) myalgias  . Levofloxacin     Other reaction(s): Other (See Comments) CNS side effects  . Lubiprostone Nausea Only  . Nitrofurantoin Monohyd Macro     Other reaction(s): Unknown  . Penicillins     As a child  . Pravastatin Other (See Comments)    myalgias  . Rosuvastatin     Other reaction(s): Muscle Pain  . Simvastatin Other (See Comments)    myalgias  . Statins     Other reaction(s): Muscle Pain  . Tetracycline     Other reaction(s): Other (See Comments) Hives and throat swelling  . Warfarin Sodium     Other reaction(s): Abdominal Pain  . Sulfa Antibiotics Itching    ? Current Outpatient Medications  Medication Sig Dispense Refill  . aspirin 81 MG tablet Take 81 mg by mouth daily.    . benazepril-hydrochlorthiazide (LOTENSIN HCT) 20-12.5 MG per tablet Take 1 tablet by mouth daily.  5  . diphenhydrAMINE (BENADRYL) 25 MG tablet Take 25 mg by mouth at bedtime as needed.    . donepezil (ARICEPT) 5 MG tablet Take by mouth.    . ferrous sulfate 325 (65 FE) MG tablet   11  . FLUoxetine (PROZAC) 20 MG tablet     . fluticasone (FLONASE) 50 MCG/ACT nasal spray SPRAY 2 SPRAYS INTO BOTH NOSTRILS EVERY NIGHT  5  . haloperidol (HALDOL) 0.5 MG tablet Take 0.5 mg by mouth 2 (two) times daily. Patient states it lowers her blood pressure 100/40. 03/01/2018  2  . HYDROcodone-acetaminophen (NORCO) 5-325 MG tablet Take 1 tablet by mouth every 6 (six) hours as needed for moderate pain. 15 tablet 0  . Ipratropium-Albuterol (COMBIVENT RESPIMAT) 20-100 MCG/ACT AERS respimat Reported on 07/06/2015    . levETIRAcetam (KEPPRA) 250 MG tablet TAKE 1 TABLET (250 MG TOTAL) BY MOUTH ONCE DAILY.  10  . loratadine (CLARITIN) 10 MG tablet Take 10 mg by mouth daily as needed for allergies.    . metoprolol succinate (TOPROL-XL)  25 MG 24 hr tablet 25 mg daily.   3  . Naproxen Sodium (ALEVE PO) Take by mouth.    Marland Kitchen omeprazole (PRILOSEC) 20 MG capsule Take 20 mg by mouth daily.  6  . rOPINIRole (REQUIP) 0.25 MG tablet Take 0.25 mg by mouth at bedtime.  5  . sertraline (ZOLOFT) 50 MG tablet TAKE 1 TABLET BY MOUTH EVERY DAY AT NIGHT  3  . SYMBICORT 80-4.5 MCG/ACT inhaler Inhale 2 puffs into the lungs 2 (two) times daily.  3  . vitamin B-12 (CYANOCOBALAMIN) 1000 MCG tablet Take by mouth.     No current facility-administered medications for this visit.      Abtx:  Anti-infectives (From admission, onward)   None      REVIEW OF SYSTEMS:  Const: negative fever, negative chills, negative weight loss Eyes: Legally blind ENT: negative coryza, negative sore throat Resp: negative cough, hemoptysis, dyspnea Cards: negative for chest pain, palpitations, lower extremity edema GU: Has incontinence GI: Negative for abdominal pain, diarrhea, bleeding, constipation Skin: negative for rash and pruritus Heme: negative for easy bruising and gum/nose bleeding MS: negative for myalgias, arthralgias, back pain and muscle weakness Neurolo:negative for headaches, dizziness, vertigo, memory problems  Psych: negative for feelings of anxiety, depression  Endocrine: negative for thyroid, diabetes Allergy/Immunology- negative for any medication or food allergies ? Pertinent Positives include : Objective:  VITALS:  BP (!) 165/65 (BP Location: Right Arm, Patient Position: Sitting, Cuff Size: Normal)   Pulse 61   Temp (!) 97.4 F (36.3 C) (Oral)   Ht 5\' 1"  (1.549 m)   BMI 24.37 kg/m  PHYSICAL EXAM: Limited examination because patient is in wheelchair General: Alert, cooperative, no distress, in wheelchair Head: Normocephalic, without obvious abnormality, atraumatic. ENT not examined due to mask Neck: Supple, symmetrical, no adenopathy, thyroid: non tender no carotid bruit and no JVD. Back: No CVA tenderness. Lungs: Clear to  auscultation bilaterally. No Wheezing or Rhonchi. No rales. Heart: Regular Abdomen: Soft, non-tender,not distended. Bowel sounds normal. No masses, no intertrigo over the inguinal area.  Did not examine genital area Extremities: Thin fragile skin, some bruising lymph: Cervical, supraclavicular normal. Neurologic: Grossly non-focal Pertinent Labs Lab Results CBC    Component Value Date/Time   WBC 9.2 03/05/2018 1032   RBC 5.14 (H) 03/05/2018 1032   HGB 15.1 (H) 03/05/2018 1032   HCT 46.2 (H) 03/05/2018 1032   PLT 235 03/05/2018 1032   MCV 89.9 03/05/2018 1032   MCH 29.4 03/05/2018 1032   MCHC 32.7 03/05/2018 1032   RDW 13.7 03/05/2018 1032   LYMPHSABS 2.2 02/04/2018 1838   MONOABS 0.7 02/04/2018 1838   EOSABS 0.8 (H) 02/04/2018 1838   BASOSABS 0.1 02/04/2018 1838    CMP Latest Ref Rng & Units 03/05/2018 02/04/2018  Glucose 70 - 99 mg/dL 97 92  BUN 8 - 23 mg/dL 17 17  Creatinine 7.820.44 - 1.00 mg/dL 9.560.67 2.130.72  Sodium 086135 - 145 mmol/L 144 142  Potassium 3.5 - 5.1 mmol/L 3.9 3.8  Chloride 98 - 111 mmol/L 105 106  CO2 22 - 32 mmol/L 29 29  Calcium 8.9 - 10.3 mg/dL 9.3 9.3  Total Protein 6.5 - 8.1 g/dL - 7.2  Total Bilirubin 0.3 - 1.2 mg/dL - 0.5  Alkaline Phos 38 - 126 U/L - 49  AST 15 - 41 U/L - 22  ALT 0 - 44 U/L - 14      Microbiology: Recent Results (from the past 240 hour(s))  Microscopic Examination     Status: Abnormal   Collection Time: 10/21/18 12:05 PM   URINE  Result Value Ref Range Status   WBC, UA >30 (A) 0 - 5 /hpf Final   RBC None seen  0 - 2 /hpf Final   Epithelial Cells (non renal) 0-10 0 - 10 /hpf Final   Bacteria, UA Moderate (A) None seen/Few Final  CULTURE, URINE COMPREHENSIVE     Status: Abnormal   Collection Time: 10/21/18  4:41 PM   Specimen: Urine   URINE  Result Value Ref Range Status   Urine Culture, Comprehensive Final report (A)  Final   Organism ID, Bacteria Proteus mirabilis (A)  Final    Comment: Greater than 100,000 colony forming  units per mL Cefazolin <=4 ug/mL Cefazolin with an MIC <=16 predicts susceptibility to the oral agents cefaclor, cefdinir, cefpodoxime, cefprozil, cefuroxime, cephalexin, and loracarbef when used for therapy of uncomplicated urinary tract infections due to E. coli, Klebsiella pneumoniae, and Proteus mirabilis.    ANTIMICROBIAL SUSCEPTIBILITY Comment  Final    Comment:       ** S = Susceptible; I = Intermediate; R = Resistant **                    P = Positive; N = Negative             MICS are expressed in micrograms per mL    Antibiotic                 RSLT#1    RSLT#2    RSLT#3    RSLT#4 Amoxicillin/Clavulanic Acid    S Ampicillin                     S Cefepime                       S Ceftriaxone                    S Cefuroxime                     S Ciprofloxacin                  S Ertapenem                      S Gentamicin                     S Levofloxacin                   S Meropenem                      S Nitrofurantoin                 R Piperacillin/Tazobactam        S Tetracycline                   R Tobramycin                     S Trimethoprim/Sulfa             S      ? Impression/Recommendation 85 year old female presents with incontinence and polyuria and urine showing bacteria.  Proteus and E. coli in the urine.  She does not have UTI but has  bacteriuria.  These are stool organisms and can contaminate the urine as her hygiene is not adequate secondary to inability to wash. She also has genitourinary menopausal syndrome.  She does not have any residual urine suggestive of incomplete emptying as per Dr. Bernardo Heater.  Recommend not to send the urine for analysis and culture routinely just for incontinence. Refrain from antibiotics unless she has systemic symptoms  Recommend the following A )Purewick urine collection system instead of depends  would help to keep her dry especially overnight and prevent yeast infection which may cause burning. B)  showers rather than  sponge bath to keep her clean C )Estrace cream for topical application D )probiotic E)cranberry supplements F) [place] exercise G) Cetaphil wash for genital area. h) antibiotics only for systemic illness or before cystoscopy and not for incontinence or  burning.  It may be beneficial to measure the amount of urine to make sure she does not have any diabetes insipidus.  As she complains of polyuria.  Her blood glucose was normal in May 2020.  Multiple antibiotic allergies.  Not sure whether they were all true allergies.  She does complain of hives to amoxicillin and tetracycline.  Patient was picking on her scalp constantly in my office and saying to me that she has fungal infection on the scalp.  And when I examined the scalp there was not a single lesion I could  find on the scalp.     Discussed with Dr. Lonna CobbStoioff.  If he is planning to do cystoscopy then he could give her p.o. Keflex or ceftriaxone pre-cystoscopy as antibiotic prophylaxis. ? ? ___________________________________________________ Discussed with patient, and her caregiver in great detail Discussed with Dr. Lonna CobbStoioff Note:  This document was prepared using Dragon voice recognition software and may include unintentional dictation errors.

## 2018-10-26 NOTE — Patient Instructions (Addendum)
You are here for a possible UTI You currently have symptoms suggestive of Genito urinary menopausal syndrome Stay away from antibiotics unless you have fever Have a shower and wash your self completely *Estrace /Premarin cream topically- peasized apply topically three times a week * Cetaphil to clean the genital area ( not soap)  * Cranberry supplement (-Knudsen cranberry concentrate- 1 ounce mixed with 8 ounces of water *wash with water after bowel movt *Probiotic for vaginal health( can try Pearls vaginal health) **Ask your doctors not to check your urine on a routine basis  *Avoid antibiotics unless systemic infection/ or before cystoscopy * Kegel Exercise to strengthen pelvic floor explore Purewick  For incontinence

## 2018-10-26 NOTE — Telephone Encounter (Signed)
-----   Message from Abbie Sons, MD sent at 10/25/2018  9:24 PM EDT ----- Urine culture was positive but she is either allergic or intolerant of all of the sensitive antibiotics.  She declined her initial ID evaluation but recommend she be seen by ID regarding recommendations for antibiotic therapy if indicated

## 2018-10-27 NOTE — Telephone Encounter (Signed)
The ID physician contacted me and she is presently asymptomatic.  Treatment was not recommended.  Will hold off on cystoscopy as it will be low yield.

## 2018-11-02 ENCOUNTER — Other Ambulatory Visit: Payer: Self-pay | Admitting: Licensed Clinical Social Worker

## 2018-11-02 DIAGNOSIS — N3945 Continuous leakage: Secondary | ICD-10-CM

## 2018-11-02 MED ORDER — ESTRADIOL 0.1 MG/GM VA CREA: 1.0000 | TOPICAL_CREAM | VAGINAL | 0 refills | Status: AC

## 2018-12-17 ENCOUNTER — Ambulatory Visit (LOCAL_COMMUNITY_HEALTH_CENTER): Payer: Medicare Other | Admitting: Nurse Practitioner

## 2018-12-17 DIAGNOSIS — Z111 Encounter for screening for respiratory tuberculosis: Secondary | ICD-10-CM | POA: Diagnosis not present

## 2018-12-20 ENCOUNTER — Ambulatory Visit (LOCAL_COMMUNITY_HEALTH_CENTER): Payer: Medicare Other | Admitting: Nurse Practitioner

## 2018-12-20 DIAGNOSIS — Z111 Encounter for screening for respiratory tuberculosis: Secondary | ICD-10-CM

## 2018-12-31 LAB — TB SKIN TEST
Induration: 0 mm
TB Skin Test: NEGATIVE

## 2019-04-17 IMAGING — CT CT HEAD W/O CM
3 series · 15 of 47 positions shown, 18 images · non-contrast
Comparison: 06/25/2015

CLINICAL DATA: Pt has been having visual hallucinations since after
lunch. Pt was recently dx with UTI and started on Keflex. Pt legally
blind. Pt stating she is seeing "cartoon" characters.

EXAM:
CT HEAD WITHOUT CONTRAST
TECHNIQUE: Contiguous axial images were obtained from the base of the skull
through the vertex without intravenous contrast.

[Series 2: head wo · axial · 0.41mm/px · z∈[+291,+416]mm · 9 of 31 slices shown, 12 images]
[im 3/31  brain]
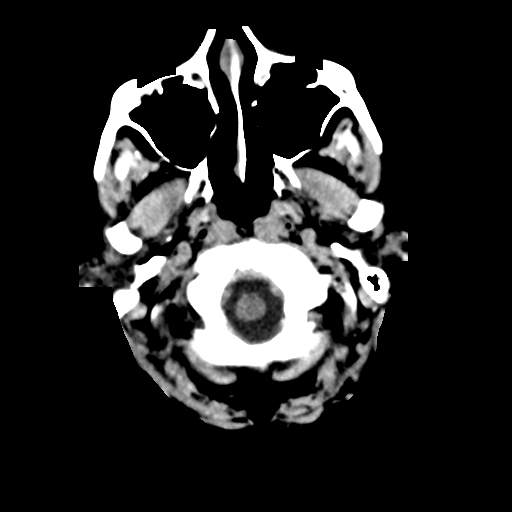
[im 3/31  bone]
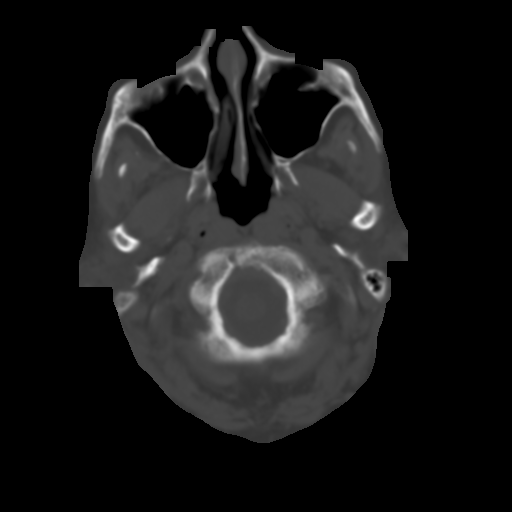
[im 6/31  brain]
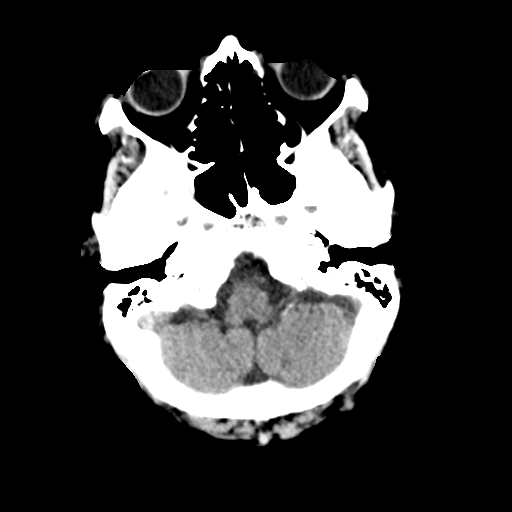
[im 9/31  brain]
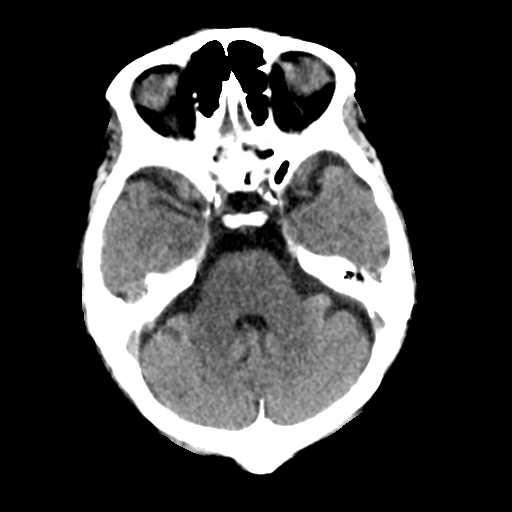
[im 12/31  brain]
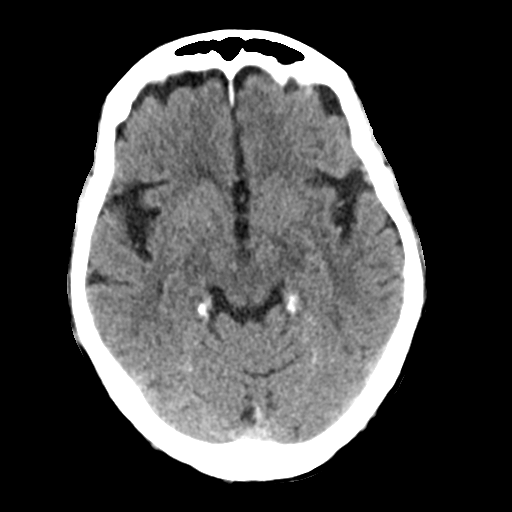
[im 16/31  brain]
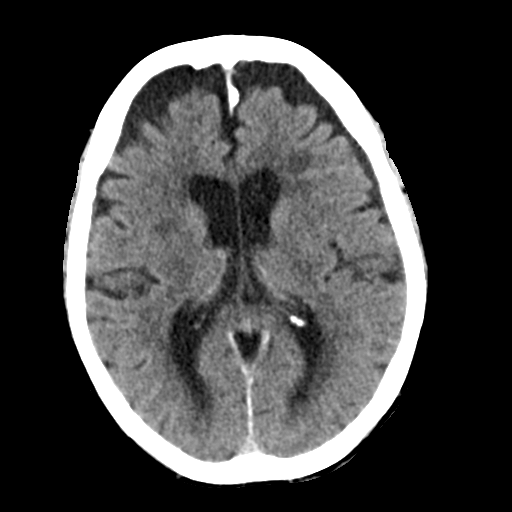
[im 16/31  bone]
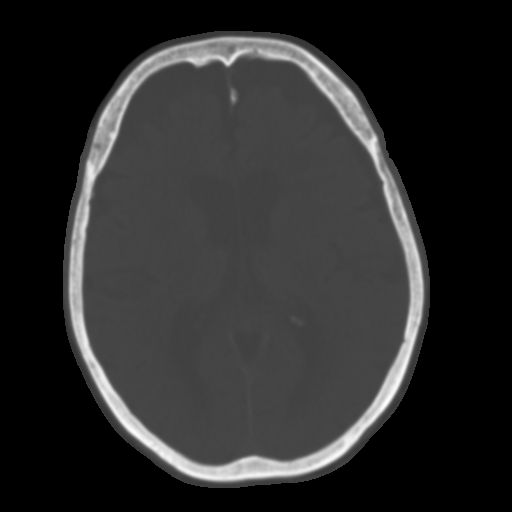
[im 19/31  brain]
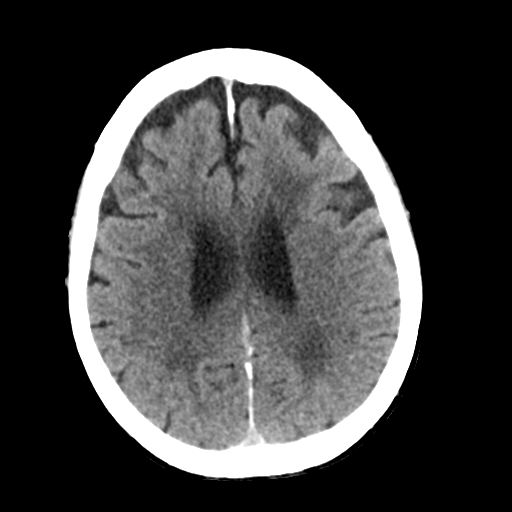
[im 22/31  brain]
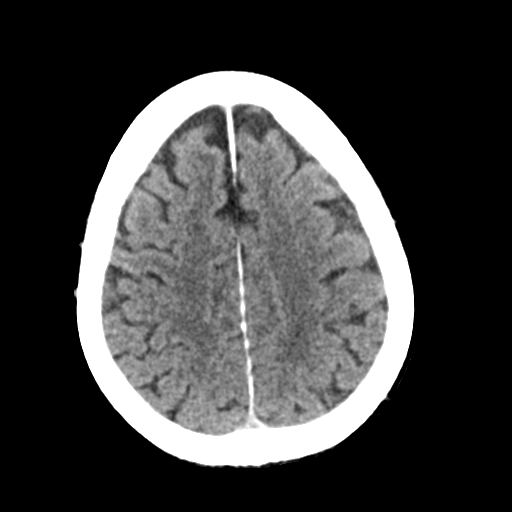
[im 25/31  brain]
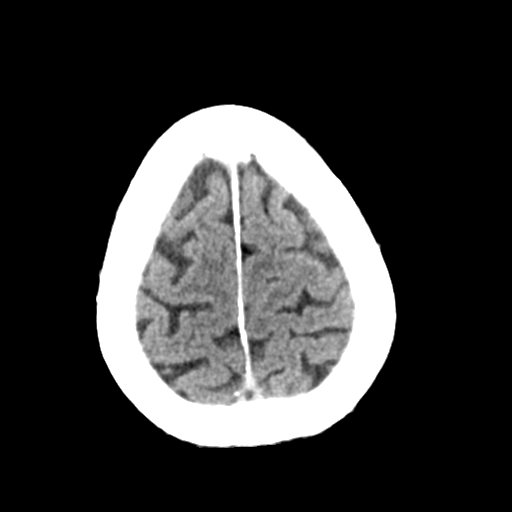
[im 28/31  brain]
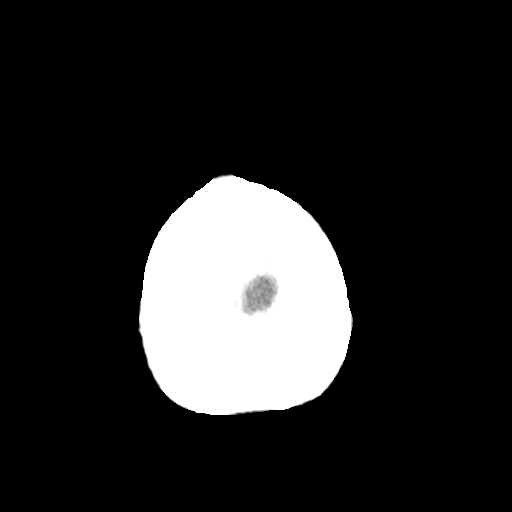
[im 28/31  bone]
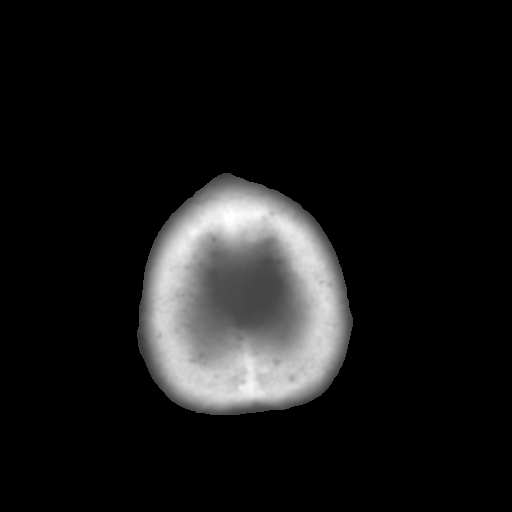

[Series 4: coronal soft tissue · coronal · 0.31mm/px · 3 of 63 slices shown]
[im 21/63  brain]
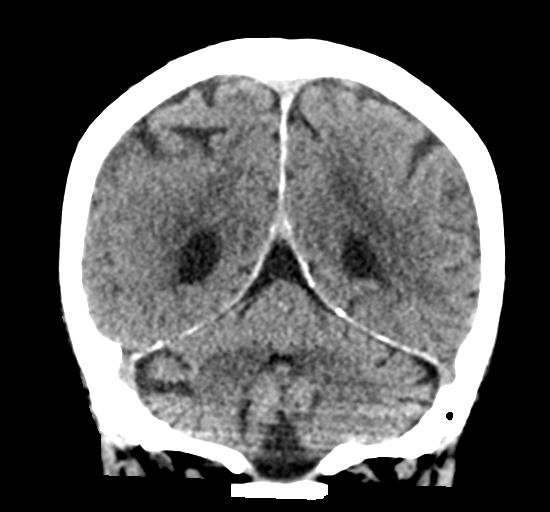
[im 28/63  brain]
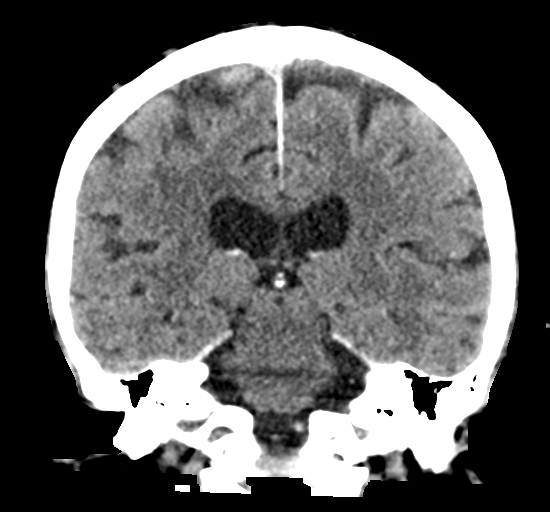
[im 35/63  brain]
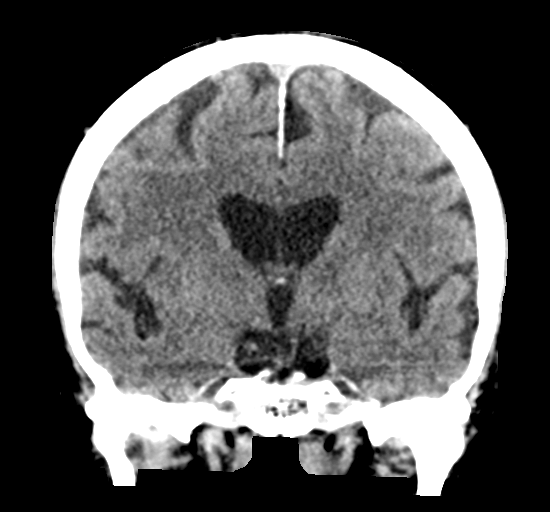

[Series 5: sagittal soft tissue · sagittal · 0.31mm/px · 3 of 53 slices shown]
[im 18/53  brain]
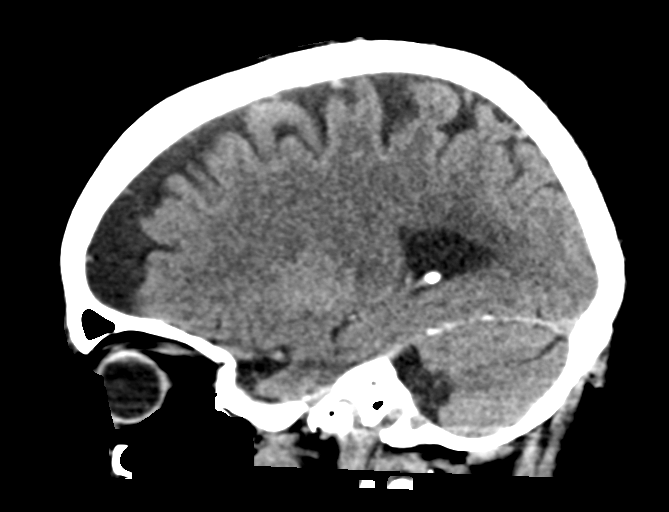
[im 27/53  brain]
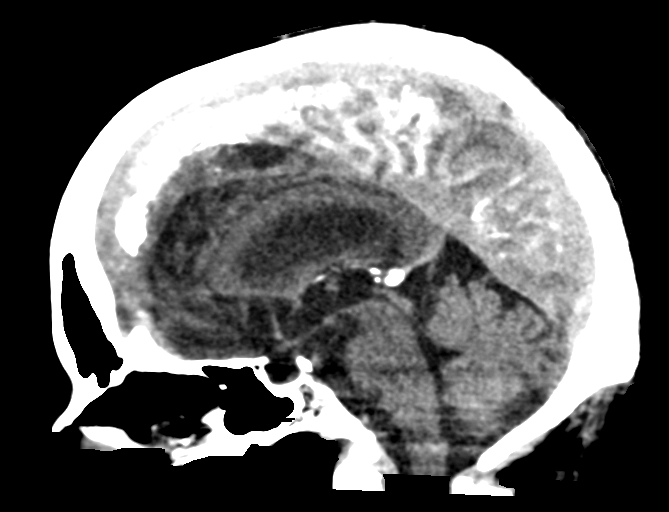
[im 35/53  brain]
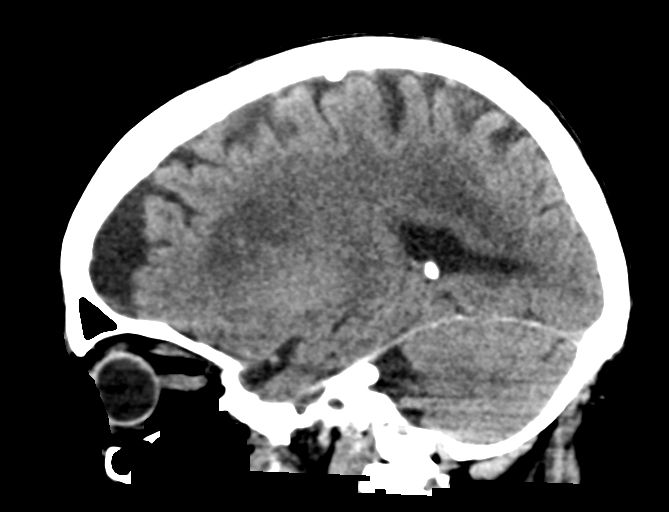

[15 of 47 positions shown; findings below may reference images not displayed]

FINDINGS: Brain: Diffuse parenchymal atrophy. Patchy areas of hypoattenuation
in deep and periventricular white matter bilaterally. Negative for
acute intracranial hemorrhage, mass lesion, acute infarction,
midline shift, or mass-effect. Acute infarct may be inapparent on
noncontrast CT. Ventricles and sulci symmetric.

Vascular: Atherosclerotic and physiologic intracranial
calcifications.

Skull: Normal. Negative for fracture or focal lesion.

Sinuses/Orbits: No acute finding.

Other: None
IMPRESSION: 1. Negative for bleed or other acute intracranial process.
2. Atrophy and nonspecific white matter changes.

## 2019-08-06 DEATH — deceased
